# Patient Record
Sex: Female | Born: 1960
Health system: Southern US, Community
[De-identification: ages and names within clinical notes are randomized; demographics above are authoritative.]

## PROBLEM LIST (undated history)

## (undated) DIAGNOSIS — R209 Unspecified disturbances of skin sensation: Secondary | ICD-10-CM

## (undated) DIAGNOSIS — F411 Generalized anxiety disorder: Secondary | ICD-10-CM

## (undated) DIAGNOSIS — K219 Gastro-esophageal reflux disease without esophagitis: Secondary | ICD-10-CM

## (undated) DIAGNOSIS — J45909 Unspecified asthma, uncomplicated: Secondary | ICD-10-CM

## (undated) DIAGNOSIS — J302 Other seasonal allergic rhinitis: Secondary | ICD-10-CM

## (undated) DIAGNOSIS — I10 Essential (primary) hypertension: Secondary | ICD-10-CM

## (undated) DIAGNOSIS — N951 Menopausal and female climacteric states: Secondary | ICD-10-CM

## (undated) DIAGNOSIS — C44621 Squamous cell carcinoma of skin of unspecified upper limb, including shoulder: Secondary | ICD-10-CM

## (undated) DIAGNOSIS — J309 Allergic rhinitis, unspecified: Secondary | ICD-10-CM

## (undated) HISTORY — DX: Unspecified asthma, uncomplicated: J45.909

## (undated) HISTORY — PX: LASIK: SHX215

## (undated) HISTORY — DX: Gastro-esophageal reflux disease without esophagitis: K21.9

## (undated) HISTORY — DX: Essential (primary) hypertension: I10

## (undated) HISTORY — DX: Generalized anxiety disorder: F41.1

## (undated) HISTORY — DX: Other seasonal allergic rhinitis: J30.2

## (undated) HISTORY — DX: Menopausal and female climacteric states: N95.1

## (undated) HISTORY — DX: Allergic rhinitis, unspecified: J30.9

## (undated) HISTORY — PX: SQUAMOUS CELL CARCINOMA EXCISION: SHX2433

## (undated) HISTORY — DX: Unspecified disturbances of skin sensation: R20.9

## (undated) HISTORY — DX: Squamous cell carcinoma of skin of unspecified upper limb, including shoulder: C44.621

---

## 1998-03-06 ENCOUNTER — Other Ambulatory Visit: Admission: RE | Admit: 1998-03-06 | Discharge: 1998-03-06 | Payer: Self-pay | Admitting: Obstetrics and Gynecology

## 1999-03-25 ENCOUNTER — Other Ambulatory Visit: Admission: RE | Admit: 1999-03-25 | Discharge: 1999-03-25 | Payer: Self-pay | Admitting: Obstetrics and Gynecology

## 1999-04-15 ENCOUNTER — Other Ambulatory Visit: Admission: RE | Admit: 1999-04-15 | Discharge: 1999-04-15 | Payer: Self-pay | Admitting: Obstetrics and Gynecology

## 1999-04-15 ENCOUNTER — Encounter (INDEPENDENT_AMBULATORY_CARE_PROVIDER_SITE_OTHER): Payer: Self-pay

## 1999-10-09 ENCOUNTER — Other Ambulatory Visit: Admission: RE | Admit: 1999-10-09 | Discharge: 1999-10-09 | Payer: Self-pay | Admitting: Obstetrics and Gynecology

## 2000-04-08 ENCOUNTER — Other Ambulatory Visit: Admission: RE | Admit: 2000-04-08 | Discharge: 2000-04-08 | Payer: Self-pay | Admitting: Obstetrics and Gynecology

## 2001-05-08 ENCOUNTER — Other Ambulatory Visit: Admission: RE | Admit: 2001-05-08 | Discharge: 2001-05-08 | Payer: Self-pay | Admitting: Obstetrics and Gynecology

## 2002-06-07 ENCOUNTER — Other Ambulatory Visit: Admission: RE | Admit: 2002-06-07 | Discharge: 2002-06-07 | Payer: Self-pay | Admitting: Obstetrics and Gynecology

## 2003-08-22 ENCOUNTER — Other Ambulatory Visit: Admission: RE | Admit: 2003-08-22 | Discharge: 2003-08-22 | Payer: Self-pay | Admitting: Obstetrics and Gynecology

## 2004-07-22 ENCOUNTER — Ambulatory Visit: Payer: Self-pay | Admitting: Internal Medicine

## 2004-10-20 ENCOUNTER — Other Ambulatory Visit: Admission: RE | Admit: 2004-10-20 | Discharge: 2004-10-20 | Payer: Self-pay | Admitting: Obstetrics and Gynecology

## 2004-10-22 ENCOUNTER — Ambulatory Visit: Payer: Self-pay | Admitting: Internal Medicine

## 2006-10-28 ENCOUNTER — Ambulatory Visit: Payer: Self-pay | Admitting: Internal Medicine

## 2008-01-18 ENCOUNTER — Encounter: Admission: RE | Admit: 2008-01-18 | Discharge: 2008-01-18 | Payer: Self-pay | Admitting: Obstetrics and Gynecology

## 2008-01-23 ENCOUNTER — Ambulatory Visit: Payer: Self-pay | Admitting: Internal Medicine

## 2008-01-23 DIAGNOSIS — J309 Allergic rhinitis, unspecified: Secondary | ICD-10-CM

## 2008-01-23 DIAGNOSIS — J45909 Unspecified asthma, uncomplicated: Secondary | ICD-10-CM

## 2008-01-23 DIAGNOSIS — T7840XA Allergy, unspecified, initial encounter: Secondary | ICD-10-CM

## 2008-01-23 DIAGNOSIS — F411 Generalized anxiety disorder: Secondary | ICD-10-CM

## 2008-01-23 DIAGNOSIS — K219 Gastro-esophageal reflux disease without esophagitis: Secondary | ICD-10-CM

## 2008-01-23 DIAGNOSIS — R319 Hematuria, unspecified: Secondary | ICD-10-CM

## 2008-01-23 DIAGNOSIS — K5289 Other specified noninfective gastroenteritis and colitis: Secondary | ICD-10-CM

## 2008-01-23 HISTORY — DX: Gastro-esophageal reflux disease without esophagitis: K21.9

## 2008-01-23 HISTORY — DX: Allergic rhinitis, unspecified: J30.9

## 2008-01-23 HISTORY — DX: Generalized anxiety disorder: F41.1

## 2008-01-23 HISTORY — DX: Unspecified asthma, uncomplicated: J45.909

## 2008-01-23 LAB — CONVERTED CEMR LAB
Protein, U semiquant: >=300
pH: 5

## 2008-02-05 ENCOUNTER — Ambulatory Visit: Payer: Self-pay | Admitting: Internal Medicine

## 2008-02-05 LAB — CONVERTED CEMR LAB
Glucose, Urine, Semiquant: NEGATIVE
pH: 5

## 2008-08-20 ENCOUNTER — Ambulatory Visit: Payer: Self-pay | Admitting: Internal Medicine

## 2008-08-20 DIAGNOSIS — R209 Unspecified disturbances of skin sensation: Secondary | ICD-10-CM | POA: Insufficient documentation

## 2008-08-20 HISTORY — DX: Unspecified disturbances of skin sensation: R20.9

## 2009-09-12 ENCOUNTER — Ambulatory Visit: Payer: Self-pay | Admitting: Internal Medicine

## 2009-09-12 DIAGNOSIS — I10 Essential (primary) hypertension: Secondary | ICD-10-CM

## 2009-09-12 HISTORY — DX: Essential (primary) hypertension: I10

## 2009-09-12 LAB — CONVERTED CEMR LAB
AST: 16 units/L (ref 0–37)
Albumin: 4.3 g/dL (ref 3.5–5.2)
CO2: 17 meq/L — ABNORMAL LOW (ref 19–32)
Calcium: 8.7 mg/dL (ref 8.4–10.5)
Creatinine, Ser: 0.78 mg/dL (ref 0.40–1.20)
Eosinophils Absolute: 0.2 10*3/uL (ref 0.0–0.7)
HCT: 41.5 % (ref 36.0–46.0)
Hemoglobin: 13.7 g/dL (ref 12.0–15.0)
Lymphocytes Relative: 21.4 % (ref 12.0–46.0)
Lymphs Abs: 1.2 10*3/uL (ref 0.7–4.0)
MCV: 99 fL (ref 78.0–100.0)
Monocytes Absolute: 0.5 10*3/uL (ref 0.1–1.0)
Neutrophils Relative %: 66.7 % (ref 43.0–77.0)
Platelets: 249 10*3/uL (ref 150.0–400.0)
RBC: 4.19 M/uL (ref 3.87–5.11)
RDW: 13.4 % (ref 11.5–14.6)

## 2009-09-15 ENCOUNTER — Encounter: Payer: Self-pay | Admitting: Internal Medicine

## 2009-10-14 ENCOUNTER — Ambulatory Visit: Payer: Self-pay | Admitting: Internal Medicine

## 2010-01-22 ENCOUNTER — Encounter: Admission: RE | Admit: 2010-01-22 | Discharge: 2010-01-22 | Payer: Self-pay | Admitting: Obstetrics and Gynecology

## 2010-04-15 ENCOUNTER — Ambulatory Visit: Payer: Self-pay | Admitting: Internal Medicine

## 2010-09-20 ENCOUNTER — Encounter: Payer: Self-pay | Admitting: Obstetrics and Gynecology

## 2010-10-01 NOTE — Assessment & Plan Note (Signed)
Summary: 6 month rov/njr/PT [facility] FROM BMP/CJR   Vital Signs:  Patient profile:   50 year old female Weight:      139 pounds Temp:     98.1 degrees F oral BP sitting:   120 / 80  (left arm) Cuff size:   regular  Vitals Entered By: Kathrynn Speed CMA (April 15, 2010 11:03 AM) CC: 6 month rov, FU on BP,src   CC:  6 month rov, FU on BP, and src.  History of Present Illness: 50 year old patient who is seen today for follow-up.  She has a history of hypertension and does monitor blood pressures carefully at home.  She continues to lose weight.  Blood pressure readings at home and also by GYN and allergy have been well controlled.  No concerns or complaints today.  Her allergy and asthma symptoms, stable  Current Medications (verified): 1)  Zyrtec Allergy 10 Mg  Tabs (Cetirizine Hcl) .Marland Kitchen.. 1 Once Daily 2)  Cvs Omeprazole 20 Mg  Tbec (Omeprazole) .Marland Kitchen.. 1 Once Daily 3)  Asmanex 30 Metered Doses 110 Mcg/inh  Aepb (Mometasone Furoate) .... 2 Puffs Once Daily 4)  Lisinopril-Hydrochlorothiazide 10-12.5 Mg Tabs (Lisinopril-Hydrochlorothiazide) .... One Daily  Allergies (verified): No Known Drug Allergies  Past History:  Past Medical History: Reviewed history from 09/12/2009 and no changes required. Asthma Anxiety GERD Allergic rhinitis Hypertension- January 2011  Past Surgical History: Reviewed history from 01/23/2008 and no changes required. gravida two, para two, aborta zero  Physical Exam  General:  Well-developed,well-nourished,in no acute distress; alert,appropriate and cooperative throughout examination; 110/74 Eyes:  No corneal or conjunctival inflammation noted. EOMI. Perrla. Funduscopic exam benign, without hemorrhages, exudates or papilledema. Vision grossly normal. Mouth:  Oral mucosa and oropharynx without lesions or exudates.  Teeth in good repair. Neck:  No deformities, masses, or tenderness noted. Lungs:  Normal respiratory effort, chest expands symmetrically. Lungs  are clear to auscultation, no crackles or wheezes. Heart:  Normal rate and regular rhythm. S1 and S2 normal without gallop, murmur, click, rub or other extra sounds.   Impression & Recommendations:  Problem # 1:  HYPERTENSION (ICD-401.9)  Her updated medication list for this problem includes:    Lisinopril-hydrochlorothiazide 10-12.5 Mg Tabs (Lisinopril-hydrochlorothiazide) ..... One daily  Her updated medication list for this problem includes:    Lisinopril-hydrochlorothiazide 10-12.5 Mg Tabs (Lisinopril-hydrochlorothiazide) ..... One daily  Complete Medication List: 1)  Zyrtec Allergy 10 Mg Tabs (Cetirizine hcl) .Marland Kitchen.. 1 once daily 2)  Cvs Omeprazole 20 Mg Tbec (Omeprazole) .Marland Kitchen.. 1 once daily 3)  Asmanex 30 Metered Doses 110 Mcg/inh Aepb (Mometasone furoate) .... 2 puffs once daily 4)  Lisinopril-hydrochlorothiazide 10-12.5 Mg Tabs (Lisinopril-hydrochlorothiazide) .... One daily  Patient Instructions: 1)  Please schedule a follow-up appointment in 1 year. 2)  Limit your Sodium (Salt). 3)  It is important that you exercise regularly at least 20 minutes 5 times a week. If you develop chest pain, have severe difficulty breathing, or feel very tired , stop exercising immediately and seek medical attention. 4)  You need to lose weight. Consider a lower calorie diet and regular exercise.  5)  Check your Blood Pressure regularly. If it is above: 160/90 you should make an appointment. Prescriptions: LISINOPRIL-HYDROCHLOROTHIAZIDE 10-12.5 MG TABS (LISINOPRIL-HYDROCHLOROTHIAZIDE) one daily  #90 x 6   Entered and Authorized by:   Gordy Savers  MD   Signed by:   Gordy Savers  MD on 04/15/2010   Method used:   Electronically to  [facility]* (retail)       [address]       [place], [place]  [id number]       Ph: [phone]       Fax: [phone]   RxID:   [id number]

## 2010-10-01 NOTE — Letter (Signed)
Summary: [facility]  [facility]   Imported By: Sherian Rein 09/24/2009 08:17:26  _____________________________________________________________________  External Attachment:    Type:   Image     Comment:   External Document

## 2010-10-01 NOTE — Assessment & Plan Note (Signed)
Summary: bp elevated/njr   Vital Signs:  Patient profile:   50 year old female Weight:      156 pounds Temp:     97.9 degrees F oral BP sitting:   160 / 108  (left arm) Cuff size:   regular  Vitals Entered By: Raechel Ache, RN (September 12, 2009 10:55 AM) CC: C/o elevated BP.   CC:  C/o elevated BP.Marland Kitchen  History of Present Illness: 50 year old patient who is seen today to evaluate her hypertension.  She has been a hypertensive suspect in the past, but has maintained  good readings until recently.  she is followed by gynecology and allergy.  She also monitor his home blood pressure readings.  In spite of weight loss.  Her blood pressure has become elevated recently.  She does have a family history of hypertension.  She feels well today, although slightly anxious about her low-pressure readings.  Allergies: No Known Drug Allergies  Past History:  Past Medical History: Asthma Anxiety GERD Allergic rhinitis Hypertension- January 2011  Family History: Reviewed history from 01/23/2008 and no changes required. father history of CAD, history of hypertension mother in good health  One brother two sisters in good health one with hypertension  Social History: Reviewed history from 01/23/2008 and no changes required. Married  Review of Systems  The patient denies anorexia, fever, weight loss, weight gain, vision loss, decreased hearing, hoarseness, chest pain, syncope, dyspnea on exertion, peripheral edema, prolonged cough, headaches, hemoptysis, abdominal pain, melena, hematochezia, severe indigestion/heartburn, hematuria, incontinence, genital sores, muscle weakness, suspicious skin lesions, transient blindness, difficulty walking, depression, unusual weight change, abnormal bleeding, enlarged lymph nodes, angioedema, and breast masses.    Physical Exam  General:  overweight-appearing.  160/100overweight-appearing.   Head:  Normocephalic and atraumatic without obvious  abnormalities. No apparent alopecia or balding. Eyes:  No corneal or conjunctival inflammation noted. EOMI. Perrla. Funduscopic exam benign, without hemorrhages, exudates or papilledema. Vision grossly normal. Ears:  External ear exam shows no significant lesions or deformities.  Otoscopic examination reveals clear canals, tympanic membranes are intact bilaterally without bulging, retraction, inflammation or discharge. Hearing is grossly normal bilaterally. Mouth:  Oral mucosa and oropharynx without lesions or exudates.  Teeth in good repair. Neck:  No deformities, masses, or tenderness noted. Lungs:  Normal respiratory effort, chest expands symmetrically. Lungs are clear to auscultation, no crackles or wheezes. Heart:  Normal rate and regular rhythm. S1 and S2 normal without gallop, murmur, click, rub or other extra sounds. Abdomen:  Bowel sounds positive,abdomen soft and non-tender without masses, organomegaly or hernias noted. Msk:  No deformity or scoliosis noted of thoracic or lumbar spine.   Pulses:  R and L carotid,radial,femoral,dorsalis pedis and posterior tibial pulses are full and equal bilaterally Extremities:  No clubbing, cyanosis, edema, or deformity noted with normal full range of motion of all joints.     Impression & Recommendations:  Problem # 1:  HYPERTENSION (ICD-401.9)  Orders: Venipuncture (91478) TLB-BMP (Basic Metabolic Panel-BMET) (80048-METABOL) TLB-CBC Platelet - w/Differential (85025-CBCD) TLB-Hepatic/Liver Function Pnl (80076-HEPATIC) TLB-TSH (Thyroid Stimulating Hormone) (84443-TSH)  Her updated medication list for this problem includes:    Lisinopril-hydrochlorothiazide 20-25 Mg Tabs (Lisinopril-hydrochlorothiazide) ..... One daily  Problem # 2:  ALLERGIC RHINITIS (ICD-477.9)  Her updated medication list for this problem includes:    Zyrtec Allergy 10 Mg Tabs (Cetirizine hcl) .Marland Kitchen... 1 once daily  Orders: Venipuncture (29562) TLB-BMP (Basic Metabolic  Panel-BMET) (80048-METABOL) TLB-CBC Platelet - w/Differential (85025-CBCD) TLB-Hepatic/Liver Function Pnl (80076-HEPATIC) TLB-TSH (Thyroid Stimulating Hormone) (  84443-TSH)  Complete Medication List: 1)  Zyrtec Allergy 10 Mg Tabs (Cetirizine hcl) .Marland Kitchen.. 1 once daily 2)  Cvs Omeprazole 20 Mg Tbec (Omeprazole) .Marland Kitchen.. 1 once daily 3)  Asmanex 30 Metered Doses 110 Mcg/inh Aepb (Mometasone furoate) .... 2 puffs once daily 4)  Lisinopril-hydrochlorothiazide 20-25 Mg Tabs (Lisinopril-hydrochlorothiazide) .... One daily  Patient Instructions: 1)  Please schedule a follow-up appointment in 1 month. 2)  Limit your Sodium (Salt). 3)  It is important that you exercise regularly at least 20 minutes 5 times a week. If you develop chest pain, have severe difficulty breathing, or feel very tired , stop exercising immediately and seek medical attention. 4)  You need to lose weight. Consider a lower calorie diet and regular exercise.  5)  Check your Blood Pressure regularly. If it is above: 150/90 you should make an appointment. Prescriptions: LISINOPRIL-HYDROCHLOROTHIAZIDE 20-25 MG TABS (LISINOPRIL-HYDROCHLOROTHIAZIDE) one daily  #90 x 3   Entered and Authorized by:   Gordy Savers  MD   Signed by:   Gordy Savers  MD on 09/12/2009   Method used:   Print then Give to Patient   RxID:   [id number] LISINOPRIL-HYDROCHLOROTHIAZIDE 20-25 MG TABS (LISINOPRIL-HYDROCHLOROTHIAZIDE) one daily  #90 x 3   Entered and Authorized by:   Gordy Savers  MD   Signed by:   Gordy Savers  MD on 09/12/2009   Method used:   Electronically to        [facility]* (retail)       [address]       [place], [place]  [address]       Ph: [phone]       Fax: [phone]   RxID:   [id number]   Appended Document: Orders Update    Clinical Lists Changes  Orders: Added new Test order of T- * Misc. Laboratory test [phone]) - Signed

## 2010-10-01 NOTE — Assessment & Plan Note (Signed)
Summary: 1 mo rov/mm   Vital Signs:  Patient profile:   50 year old female Height:      62.5 inches Weight:      149 pounds Temp:     97.7 degrees F oral BP sitting:   108 / 70  (right arm)  Vitals Entered By: Duard Brady LPN (October 14, 2009 8:41 AM) CC: ROV / f/u on medication and labs   CC:  ROV / f/u on medication and labs.  History of Present Illness: 50 year old patient who is seen today for follow-up of her hypertension.  She has a history of mild asthma, which has been stable.  She was placed on blood pressure medication, one month ago, due to a blood pressure reading of 160/100.  She has done quite well and continues to monitor blood pressure readings at home.  She has rare episodes of dizziness when she bends over.  Blood pressures are often and a low normal range.  Blood pressure on arrival here 110/70 by my exam as low as 95/60  Allergies: No Known Drug Allergies  Past History:  Past Medical History: Reviewed history from 09/12/2009 and no changes required. Asthma Anxiety GERD Allergic rhinitis Hypertension- January 2011  Physical Exam  General:  overweight-appearing.  95/60overweight-appearing.     Impression & Recommendations:  Problem # 1:  HYPERTENSION (ICD-401.9)  The following medications were removed from the medication list:    Lisinopril-hydrochlorothiazide 20-25 Mg Tabs (Lisinopril-hydrochlorothiazide) ..... One daily Her updated medication list for this problem includes:    Lisinopril-hydrochlorothiazide 10-12.5 Mg Tabs (Lisinopril-hydrochlorothiazide) ..... One daily  The following medications were removed from the medication list:    Lisinopril-hydrochlorothiazide 20-25 Mg Tabs (Lisinopril-hydrochlorothiazide) ..... One daily Her updated medication list for this problem includes:    Lisinopril-hydrochlorothiazide 10-12.5 Mg Tabs (Lisinopril-hydrochlorothiazide) ..... One daily  Complete Medication List: 1)  Zyrtec Allergy 10 Mg  Tabs (Cetirizine hcl) .Marland Kitchen.. 1 once daily 2)  Cvs Omeprazole 20 Mg Tbec (Omeprazole) .Marland Kitchen.. 1 once daily 3)  Asmanex 30 Metered Doses 110 Mcg/inh Aepb (Mometasone furoate) .... 2 puffs once daily 4)  Lisinopril-hydrochlorothiazide 10-12.5 Mg Tabs (Lisinopril-hydrochlorothiazide) .... One daily  Patient Instructions: 1)  Please schedule a follow-up appointment in 6 months. 2)  Limit your Sodium (Salt). 3)  It is important that you exercise regularly at least 20 minutes 5 times a week. If you develop chest pain, have severe difficulty breathing, or feel very tired , stop exercising immediately and seek medical attention. 4)  You need to lose weight. Consider a lower calorie diet and regular exercise.  5)  Check your Blood Pressure regularly. If it is above: 150/90  you should make an appointment. Prescriptions: LISINOPRIL-HYDROCHLOROTHIAZIDE 10-12.5 MG TABS (LISINOPRIL-HYDROCHLOROTHIAZIDE) one daily  #90 x 6   Entered and Authorized by:   Gordy Savers  MD   Signed by:   Gordy Savers  MD on 10/14/2009   Method used:   Print then Give to Patient   RxID:   [id number]

## 2010-12-15 ENCOUNTER — Encounter: Payer: Self-pay | Admitting: Internal Medicine

## 2010-12-15 ENCOUNTER — Ambulatory Visit (INDEPENDENT_AMBULATORY_CARE_PROVIDER_SITE_OTHER): Payer: BLUE CROSS/BLUE SHIELD | Admitting: Internal Medicine

## 2010-12-15 DIAGNOSIS — H9209 Otalgia, unspecified ear: Secondary | ICD-10-CM

## 2010-12-15 MED ORDER — NEOMYCIN-POLYMYXIN-HC 3.5-10000-1 OT SOLN: 3.0000 [drp] | Freq: Three times a day (TID) | OTIC | Status: AC

## 2010-12-15 NOTE — Patient Instructions (Signed)
Call or return to clinic prn if these symptoms worsen or fail to improve as anticipated.

## 2010-12-15 NOTE — Progress Notes (Signed)
  Subjective:    Patient ID: Brandy Todd, female    DOB: 15-Dec-1960, 50 y.o.   MRN: [id number]  HPI 50 year old patient who complains of some discomfort in the left ear area. She removed some cerumen yesterday. No fever or URI symptoms. She does describe some very mild sinus congestion. She does have a history of allergic rhinitis.   Review of Systems  Eyes: Positive for pain.       Objective:   Physical Exam  Constitutional: She appears well-developed and well-nourished. No distress.  HENT:  Head: Normocephalic and atraumatic.  Right Ear: External ear normal.  Mouth/Throat: Oropharynx is clear and moist.       Both tympanic membranes and canals were normal          Assessment & Plan:  Otalgia. We'll give a prescription for Cortisporin Otic. We'll simply observe over the next 24 hours. It is likely that her symptoms will resolve since her cerumen has been removed. She will take the otic drops only if her symptoms worsen

## 2011-01-09 ENCOUNTER — Other Ambulatory Visit: Payer: Self-pay | Admitting: Internal Medicine

## 2011-04-12 ENCOUNTER — Other Ambulatory Visit (INDEPENDENT_AMBULATORY_CARE_PROVIDER_SITE_OTHER): Payer: BLUE CROSS/BLUE SHIELD

## 2011-04-12 DIAGNOSIS — Z0000 Encounter for general adult medical examination without abnormal findings: Secondary | ICD-10-CM

## 2011-04-12 LAB — HEPATIC FUNCTION PANEL
ALT: 17 U/L (ref 0–35)
Albumin: 3.8 g/dL (ref 3.5–5.2)
Alkaline Phosphatase: 52 U/L (ref 39–117)
Bilirubin, Direct: 0 mg/dL (ref 0.0–0.3)
Total Bilirubin: 0.6 mg/dL (ref 0.3–1.2)

## 2011-04-12 LAB — BASIC METABOLIC PANEL
BUN: 19 mg/dL (ref 6–23)
Chloride: 99 mEq/L (ref 96–112)
Creatinine, Ser: 0.9 mg/dL (ref 0.4–1.2)

## 2011-04-12 LAB — CBC WITH DIFFERENTIAL/PLATELET
Basophils Relative: 0.6 % (ref 0.0–3.0)
Eosinophils Absolute: 0.4 10*3/uL (ref 0.0–0.7)
Lymphs Abs: 1.3 10*3/uL (ref 0.7–4.0)
Neutro Abs: 5.2 10*3/uL (ref 1.4–7.7)
Neutrophils Relative %: 69.4 % (ref 43.0–77.0)
Platelets: 317 10*3/uL (ref 150.0–400.0)
RDW: 12.8 % (ref 11.5–14.6)

## 2011-04-12 LAB — LIPID PANEL
Total CHOL/HDL Ratio: 2
VLDL: 24.6 mg/dL (ref 0.0–40.0)

## 2011-04-12 LAB — POCT URINALYSIS DIPSTICK
Bilirubin, UA: NEGATIVE
Nitrite, UA: NEGATIVE
pH, UA: 5.5

## 2011-04-19 ENCOUNTER — Encounter: Payer: BLUE CROSS/BLUE SHIELD | Admitting: Internal Medicine

## 2011-04-20 ENCOUNTER — Encounter: Payer: Self-pay | Admitting: Internal Medicine

## 2011-04-20 ENCOUNTER — Ambulatory Visit (INDEPENDENT_AMBULATORY_CARE_PROVIDER_SITE_OTHER): Payer: BLUE CROSS/BLUE SHIELD | Admitting: Internal Medicine

## 2011-04-20 VITALS — BP 120/80 | HR 70 | Temp 98.2°F | Resp 16 | Ht 62.0 in | Wt 145.0 lb

## 2011-04-20 DIAGNOSIS — Z23 Encounter for immunization: Secondary | ICD-10-CM

## 2011-04-20 DIAGNOSIS — Z0000 Encounter for general adult medical examination without abnormal findings: Secondary | ICD-10-CM

## 2011-04-20 MED ORDER — MOMETASONE FUROATE 220 MCG/INH IN AEPB: 2.0000 | INHALATION_SPRAY | Freq: Every day | RESPIRATORY_TRACT | Status: DC

## 2011-04-20 MED ORDER — OMEPRAZOLE 20 MG PO CPDR: 20.0000 mg | DELAYED_RELEASE_CAPSULE | Freq: Every day | ORAL | Status: DC

## 2011-04-20 MED ORDER — LISINOPRIL-HYDROCHLOROTHIAZIDE 10-12.5 MG PO TABS: 1.0000 | ORAL_TABLET | Freq: Every day | ORAL | Status: DC

## 2011-04-20 NOTE — Progress Notes (Signed)
Subjective:    Patient ID: Brandy Todd, female    DOB: 1960-09-28, 50 y.o.   MRN: [id number]  HPI History of Present Illness:  50 year-old patient who is seen today for follow-up. She has a history of hypertension and does monitor blood pressures carefully at home. She continues to lose weight. Blood pressure readings at home and also by GYN and allergy have been well controlled. No concerns or complaints today. Her allergy and asthma symptoms, stable   Current Medications (verified):  1) Zyrtec Allergy 10 Mg Tabs (Cetirizine Hcl) .Marland Kitchen.. 1 Once Daily  2) Cvs Omeprazole 20 Mg Tbec (Omeprazole) .Marland Kitchen.. 1 Once Daily  3) Asmanex 30 Metered Doses 110 Mcg/inh Aepb (Mometasone Furoate) .... 2 Puffs Once Daily  4) Lisinopril-Hydrochlorothiazide 10-12.5 Mg Tabs (Lisinopril-Hydrochlorothiazide) .... One Daily   Allergies (verified):  No Known Drug Allergies   Past History:  Past Medical History:  Reviewed history from 09/12/2009 and no changes required.  Asthma  Anxiety  GERD  Allergic rhinitis  Hypertension- January 2011   Past Surgical History:  Reviewed history from 01/23/2008 and no changes required.  gravida two, para two, aborta zero    Review of Systems  Constitutional: Negative for fever, appetite change, fatigue and unexpected weight change.  HENT: Negative for hearing loss, ear pain, nosebleeds, congestion, sore throat, mouth sores, trouble swallowing, neck stiffness, dental problem, voice change, sinus pressure and tinnitus.   Eyes: Negative for photophobia, pain, redness and visual disturbance.  Respiratory: Negative for cough, chest tightness and shortness of breath.   Cardiovascular: Negative for chest pain, palpitations and leg swelling.  Gastrointestinal: Negative for nausea, vomiting, abdominal pain, diarrhea, constipation, blood in stool, abdominal distention and rectal pain.  Genitourinary: Negative for dysuria, urgency, frequency, hematuria, flank pain, vaginal bleeding,  vaginal discharge, difficulty urinating, genital sores, vaginal pain, menstrual problem and pelvic pain.  Musculoskeletal: Negative for back pain and arthralgias.  Skin: Negative for rash.  Neurological: Negative for dizziness, syncope, speech difficulty, weakness, light-headedness, numbness and headaches.  Hematological: Negative for adenopathy. Does not bruise/bleed easily.  Psychiatric/Behavioral: Negative for suicidal ideas, behavioral problems, self-injury, dysphoric mood and agitation. The patient is not nervous/anxious.        Objective:   Physical Exam  Constitutional: She is oriented to person, place, and time. She appears well-developed and well-nourished.  HENT:  Head: Normocephalic and atraumatic.  Right Ear: External ear normal.  Left Ear: External ear normal.  Mouth/Throat: Oropharynx is clear and moist.  Eyes: Conjunctivae and EOM are normal.  Neck: Normal range of motion. Neck supple. No JVD present. No thyromegaly present.  Cardiovascular: Normal rate, regular rhythm, normal heart sounds and intact distal pulses.   No murmur heard. Pulmonary/Chest: Effort normal and breath sounds normal. She has no wheezes. She has no rales.  Abdominal: Soft. Bowel sounds are normal. She exhibits no distension and no mass. There is no tenderness. There is no rebound and no guarding.  Musculoskeletal: Normal range of motion. She exhibits no edema and no tenderness.  Neurological: She is alert and oriented to person, place, and time. She has normal reflexes. No cranial nerve deficit. She exhibits normal muscle tone. Coordination normal.  Skin: Skin is warm and dry. No rash noted.  Psychiatric: She has a normal mood and affect. Her behavior is normal.          Assessment & Plan:      Preventive health examination. Laboratory studies were reviewed. The patient has been asked to moderate her sun  exposure calcium and vitamin D supplements encouraged. She'll be set up for a screening  colonoscopy. She has been asked to monitor home blood pressure readings and will recheck in one year. She'll continue her annual gynecologic evaluation

## 2011-04-20 NOTE — Patient Instructions (Signed)
Limit your sodium (Salt) intake  Take a calcium supplement, plus [phone] units of vitamin D  Schedule your colonoscopy to help detect colon cancer.  Moderate your sun exposed- excessive sun exposure increases your risk of skin cancer and premature aging of the skin    It is important that you exercise regularly, at least 20 minutes 3 to 4 times per week.  If you develop chest pain or shortness of breath seek  medical attention.  Please check your blood pressure on a regular basis.  If it is consistently greater than 150/90, please make an office appointment.  Return in one year for follow-up

## 2011-08-31 HISTORY — PX: COLONOSCOPY: SHX174

## 2011-12-17 ENCOUNTER — Telehealth: Payer: Self-pay | Admitting: Internal Medicine

## 2011-12-17 DIAGNOSIS — Z1211 Encounter for screening for malignant neoplasm of colon: Secondary | ICD-10-CM

## 2011-12-17 NOTE — Telephone Encounter (Signed)
Spoke with pt - looks like info went in but never made it ot terri - will re-order and she should hear from her next week.

## 2011-12-17 NOTE — Telephone Encounter (Signed)
Patient called stating that when she was in last someone was supposed to call her to schedule a colonoscopy. Please advise.

## 2011-12-24 ENCOUNTER — Encounter: Payer: Self-pay | Admitting: Internal Medicine

## 2012-01-04 ENCOUNTER — Encounter: Payer: Self-pay | Admitting: Internal Medicine

## 2012-01-04 ENCOUNTER — Ambulatory Visit (AMBULATORY_SURGERY_CENTER): Payer: BC Managed Care – PPO

## 2012-01-04 VITALS — Ht 62.0 in | Wt 148.2 lb

## 2012-01-04 DIAGNOSIS — Z1211 Encounter for screening for malignant neoplasm of colon: Secondary | ICD-10-CM

## 2012-01-04 MED ORDER — NA SULFATE-K SULFATE-MG SULF 17.5-3.13-1.6 GM/177ML PO SOLN: 1.0000 | Freq: Once | ORAL | Status: DC

## 2012-01-18 ENCOUNTER — Encounter: Payer: Self-pay | Admitting: Internal Medicine

## 2012-01-18 ENCOUNTER — Ambulatory Visit (AMBULATORY_SURGERY_CENTER): Payer: BC Managed Care – PPO | Admitting: Internal Medicine

## 2012-01-18 VITALS — BP 136/98 | HR 67 | Temp 96.3°F | Resp 16 | Ht 62.0 in | Wt 148.0 lb

## 2012-01-18 DIAGNOSIS — D1330 Benign neoplasm of unspecified part of small intestine: Secondary | ICD-10-CM

## 2012-01-18 DIAGNOSIS — K5000 Crohn's disease of small intestine without complications: Secondary | ICD-10-CM

## 2012-01-18 DIAGNOSIS — Z1211 Encounter for screening for malignant neoplasm of colon: Secondary | ICD-10-CM

## 2012-01-18 MED ORDER — SODIUM CHLORIDE 0.9 % IV SOLN: 500.0000 mL | INTRAVENOUS | Status: DC

## 2012-01-18 NOTE — Patient Instructions (Signed)
YOU HAD AN ENDOSCOPIC PROCEDURE TODAY AT THE [facility]: Refer to the procedure report that was given to you for any specific questions about what was found during the examination.  If the procedure report does not answer your questions, please call your gastroenterologist to clarify.  If you requested that your care partner not be given the details of your procedure findings, then the procedure report has been included in a sealed envelope for you to review at your convenience later.  YOU SHOULD EXPECT: Some feelings of bloating in the abdomen. Passage of more gas than usual.  Walking can help get rid of the air that was put into your GI tract during the procedure and reduce the bloating. If you had a lower endoscopy (such as a colonoscopy or flexible sigmoidoscopy) you may notice spotting of blood in your stool or on the toilet paper. If you underwent a bowel prep for your procedure, then you may not have a normal bowel movement for a few days.  DIET: Your first meal following the procedure should be a light meal and then it is ok to progress to your normal diet.  A half-sandwich or bowl of soup is an example of a good first meal.  Heavy or fried foods are harder to digest and may make you feel nauseous or bloated.  Likewise meals heavy in dairy and vegetables can cause extra gas to form and this can also increase the bloating.  Drink plenty of fluids but you should avoid alcoholic beverages for 24 hours.  ACTIVITY: Your care partner should take you home directly after the procedure.  You should plan to take it easy, moving slowly for the rest of the day.  You can resume normal activity the day after the procedure however you should NOT DRIVE or use heavy machinery for 24 hours (because of the sedation medicines used during the test).    SYMPTOMS TO REPORT IMMEDIATELY: A gastroenterologist can be reached at any hour.  During normal business hours, 8:30 AM to 5:00 PM Monday through Friday,  call [phone].  After hours and on weekends, please call the GI answering service at [phone] who will take a message and have the physician on call contact you.   Following lower endoscopy (colonoscopy or flexible sigmoidoscopy):  Excessive amounts of blood in the stool  Significant tenderness or worsening of abdominal pains  Swelling of the abdomen that is new, acute  Fever of 100F or higher    FOLLOW UP: If any biopsies were taken you will be contacted by phone or by letter within the next 1-3 weeks.  Call your gastroenterologist if you have not heard about the biopsies in 3 weeks.  Our staff will call the home number listed on your records the next business day following your procedure to check on you and address any questions or concerns that you may have at that time regarding the information given to you following your procedure. This is a courtesy call and so if there is no answer at the home number and we have not heard from you through the emergency physician on call, we will assume that you have returned to your regular daily activities without incident.  SIGNATURES/CONFIDENTIALITY: You and/or your care partner have signed paperwork which will be entered into your electronic medical record.  These signatures attest to the fact that that the information above on your After Visit Summary has been reviewed and is understood.  Full responsibility of the confidentiality   of this discharge information lies with you and/or your care-partner.    Information on diverticulosis and high fiber diet given to you.

## 2012-01-18 NOTE — Op Note (Signed)
 [facility] [address] [place], [place]  [address]  COLONOSCOPY PROCEDURE REPORT  PATIENT:  Brandy Todd, Brandy Todd  MR#:  [id number] BIRTHDATE:  [birth year], 51 yrs. old  GENDER:  female ENDOSCOPIST:  Carie Caddy. Mackenzie Groom, MD REF. BY:  Eleonore Chiquito, M.D. PROCEDURE DATE:  01/18/2012 PROCEDURE:  Colonoscopy with biopsy ASA CLASS:  Class II INDICATIONS:  Routine Risk Screening, 1st colonoscopy MEDICATIONS:   MAC sedation, administered by CRNA, propofol (Diprivan) 350 mg IV  DESCRIPTION OF PROCEDURE:   After the risks benefits and alternatives of the procedure were thoroughly explained, informed consent was obtained.  Digital rectal exam was performed and revealed no rectal masses.   The LB CF-[id number] endoscope was introduced through the anus and advanced to the terminal ileum which was intubated for a short distance, without limitations. The quality of the prep was Suprep good.  The instrument was then slowly withdrawn as the colon was fully examined. <<PROCEDUREIMAGES>> FINDINGS:  There were very mild inflammatory changes in the terminal ileum, characterized by erythema and a few superficial ulcerations. Multiple biopsies were obtained and sent to pathology.  Mild diverticulosis was found in the sigmoid colon. This was otherwise a normal examination of the colon.   Retroflexed views in the rectum revealed no abnormalities.  The scope was then withdrawn from the cecum and the procedure completed.  COMPLICATIONS:  None  ENDOSCOPIC IMPRESSION: 1) Mild ileitis in the terminal ileum.  Multiple biopsies obtained and sent to pathology. 2) Mild diverticulosis in the sigmoid colon 3) Otherwise normal examination  RECOMMENDATIONS: 1) Await pathology results 2) High fiber diet. 3) You should continue to follow colorectal cancer screening guidelines for "routine risk" patients with a repeat colonoscopy in 10 years. There is no need for FOBT (stool) testing for at least 5  years. 4) You will receive a letter within 1-2 weeks with the results of your biopsy as well as final recommendations. Please call my office if you have not received a letter after 3 weeks.  Carie Caddy. Rhea Belton, MD  CC:  Gordy Savers, MD The Patient  n. eSIGNED:   Carie Caddy. Adesuwa Osgood at 01/18/2012 08:53 AM  Marius Ditch, [phone]

## 2012-01-18 NOTE — Progress Notes (Signed)
Patient did not experience any of the following events: a burn prior to discharge; a fall within the facility; wrong site/side/patient/procedure/implant event; or a hospital transfer or hospital admission upon discharge from the facility. (G8907) Patient did not have preoperative order for IV antibiotic SSI prophylaxis. (G8918)  

## 2012-01-19 ENCOUNTER — Telehealth: Payer: Self-pay | Admitting: *Deleted

## 2012-01-19 NOTE — Telephone Encounter (Signed)
  Follow up Call-  Call back number 01/18/2012  Post procedure Call Back phone  # [phone]  Permission to leave phone message Yes     Patient questions:  Do you have a fever, pain , or abdominal swelling? no Pain Score  0 *  Have you tolerated food without any problems? yes  Have you been able to return to your normal activities? yes  Do you have any questions about your discharge instructions: Diet   no Medications  no Follow up visit  no  Do you have questions or concerns about your Care? no  Actions: * If pain score is 4 or above: No action needed, pain <4.

## 2012-01-27 ENCOUNTER — Encounter: Payer: Self-pay | Admitting: Internal Medicine

## 2012-04-20 ENCOUNTER — Other Ambulatory Visit (INDEPENDENT_AMBULATORY_CARE_PROVIDER_SITE_OTHER): Payer: BC Managed Care – PPO

## 2012-04-20 DIAGNOSIS — Z0000 Encounter for general adult medical examination without abnormal findings: Secondary | ICD-10-CM

## 2012-04-20 LAB — POCT URINALYSIS DIPSTICK
Bilirubin, UA: NEGATIVE
Glucose, UA: NEGATIVE
Ketones, UA: NEGATIVE
Leukocytes, UA: NEGATIVE
Spec Grav, UA: 1.015

## 2012-04-20 LAB — HEPATIC FUNCTION PANEL
Alkaline Phosphatase: 61 U/L (ref 39–117)
Bilirubin, Direct: 0.1 mg/dL (ref 0.0–0.3)
Total Bilirubin: 0.7 mg/dL (ref 0.3–1.2)

## 2012-04-20 LAB — CBC WITH DIFFERENTIAL/PLATELET
Eosinophils Absolute: 0.3 10*3/uL (ref 0.0–0.7)
Lymphocytes Relative: 26.5 % (ref 12.0–46.0)
MCHC: 33.5 g/dL (ref 30.0–36.0)
MCV: 94.2 fl (ref 78.0–100.0)
Monocytes Absolute: 0.7 10*3/uL (ref 0.1–1.0)
Neutrophils Relative %: 53.4 % (ref 43.0–77.0)
Platelets: 264 10*3/uL (ref 150.0–400.0)
RBC: 4.15 Mil/uL (ref 3.87–5.11)
WBC: 5.3 10*3/uL (ref 4.5–10.5)

## 2012-04-20 LAB — BASIC METABOLIC PANEL
BUN: 14 mg/dL (ref 6–23)
CO2: 27 mEq/L (ref 19–32)
Calcium: 8.9 mg/dL (ref 8.4–10.5)
Chloride: 99 mEq/L (ref 96–112)
Creatinine, Ser: 0.8 mg/dL (ref 0.4–1.2)

## 2012-04-20 LAB — LIPID PANEL
Cholesterol: 150 mg/dL (ref 0–200)
HDL: 75.8 mg/dL (ref >39.00)
Triglycerides: 63 mg/dL (ref 0.0–149.0)
VLDL: 12.6 mg/dL (ref 0.0–40.0)

## 2012-04-27 ENCOUNTER — Encounter: Payer: Self-pay | Admitting: Internal Medicine

## 2012-04-27 ENCOUNTER — Ambulatory Visit (INDEPENDENT_AMBULATORY_CARE_PROVIDER_SITE_OTHER): Payer: BC Managed Care – PPO | Admitting: Internal Medicine

## 2012-04-27 VITALS — BP 110/70 | HR 80 | Temp 97.9°F | Resp 20 | Ht 62.0 in | Wt 152.0 lb

## 2012-04-27 DIAGNOSIS — Z0000 Encounter for general adult medical examination without abnormal findings: Secondary | ICD-10-CM

## 2012-04-27 DIAGNOSIS — I10 Essential (primary) hypertension: Secondary | ICD-10-CM

## 2012-04-27 MED ORDER — LISINOPRIL-HYDROCHLOROTHIAZIDE 10-12.5 MG PO TABS: 1.0000 | ORAL_TABLET | Freq: Every day | ORAL | Status: DC

## 2012-04-27 NOTE — Patient Instructions (Signed)
Limit your sodium (Salt) intake    It is important that you exercise regularly, at least 20 minutes 3 to 4 times per week.  If you develop chest pain or shortness of breath seek  medical attention.  Please check your blood pressure on a regular basis.  If it is consistently greater than 150/90, please make an office appointment.  Return in one year for follow-up  

## 2012-04-27 NOTE — Progress Notes (Signed)
Patient ID: Brandy Todd, female   DOB: 15-Jul-1961, 51 y.o.   MRN: [id number]  Subjective:    Patient ID: Brandy Todd, female    DOB: 1961/04/30, 51 y.o.   MRN: [id number]  Wt Readings from Last 3 Encounters:  04/27/12 152 lb (68.947 kg)  01/18/12 148 lb (67.132 kg)  01/04/12 148 lb 3.2 oz (67.223 kg)    Hypertension Pertinent negatives include no chest pain, headaches, palpitations or shortness of breath.   History of Present Illness:   51  year-old patient who is seen today for follow-up. She has a history of hypertension and does monitor blood pressures carefully at home. She continues to lose weight. Blood pressure readings at home and also by GYN and allergy have been well controlled. No concerns or complaints today. Her allergy and asthma symptoms, stable   Current Medications (verified):  1) Zyrtec Allergy 10 Mg Tabs (Cetirizine Hcl) .Marland Kitchen.. 1 Once Daily  2) Cvs Omeprazole 20 Mg Tbec (Omeprazole) .Marland Kitchen.. 1 Once Daily  3) Asmanex 30 Metered Doses 110 Mcg/inh Aepb (Mometasone Furoate) .... 2 Puffs Once Daily  4) Lisinopril-Hydrochlorothiazide 10-12.5 Mg Tabs (Lisinopril-Hydrochlorothiazide) .... One Daily   Allergies (verified):  No Known Drug Allergies   Past History:  Past Medical History:  Reviewed history from 09/12/2009 and no changes required.  Asthma  Anxiety  GERD  Allergic rhinitis  Hypertension- January 2011   Past Surgical History:  Reviewed history from 01/23/2008 and no changes required.  gravida two, para two, aborta zero    Review of Systems  Constitutional: Negative for fever, appetite change, fatigue and unexpected weight change.  HENT: Negative for hearing loss, ear pain, nosebleeds, congestion, sore throat, mouth sores, trouble swallowing, neck stiffness, dental problem, voice change, sinus pressure and tinnitus.   Eyes: Negative for photophobia, pain, redness and visual disturbance.  Respiratory: Negative for cough, chest tightness and shortness of  breath.   Cardiovascular: Negative for chest pain, palpitations and leg swelling.  Gastrointestinal: Negative for nausea, vomiting, abdominal pain, diarrhea, constipation, blood in stool, abdominal distention and rectal pain.  Genitourinary: Negative for dysuria, urgency, frequency, hematuria, flank pain, vaginal bleeding, vaginal discharge, difficulty urinating, genital sores, vaginal pain, menstrual problem and pelvic pain.  Musculoskeletal: Negative for back pain and arthralgias.  Skin: Negative for rash.  Neurological: Negative for dizziness, syncope, speech difficulty, weakness, light-headedness, numbness and headaches.  Hematological: Negative for adenopathy. Does not bruise/bleed easily.  Psychiatric/Behavioral: Negative for suicidal ideas, behavioral problems, self-injury, dysphoric mood and agitation. The patient is not nervous/anxious.        Objective:   Physical Exam  Constitutional: She is oriented to person, place, and time. She appears well-developed and well-nourished.  HENT:  Head: Normocephalic and atraumatic.  Right Ear: External ear normal.  Left Ear: External ear normal.  Mouth/Throat: Oropharynx is clear and moist.  Eyes: Conjunctivae and EOM are normal.  Neck: Normal range of motion. Neck supple. No JVD present. No thyromegaly present.  Cardiovascular: Normal rate, regular rhythm, normal heart sounds and intact distal pulses.   No murmur heard. Pulmonary/Chest: Effort normal and breath sounds normal. She has no wheezes. She has no rales.  Abdominal: Soft. Bowel sounds are normal. She exhibits no distension and no mass. There is no tenderness. There is no rebound and no guarding.  Musculoskeletal: Normal range of motion. She exhibits no edema and no tenderness.  Neurological: She is alert and oriented to person, place, and time. She has normal reflexes. No cranial nerve deficit. She  exhibits normal muscle tone. Coordination normal.  Skin: Skin is warm and dry. No  rash noted.  Psychiatric: She has a normal mood and affect. Her behavior is normal.          Assessment & Plan:      Preventive health examination. Laboratory studies were reviewed. The patient has been asked to moderate her sun exposure calcium and vitamin D supplements encouraged. She had a screening colonoscopy earlier this year. She has been asked to monitor home blood pressure readings and will recheck in one year. She'll continue her annual gynecologic evaluation

## 2012-08-30 HISTORY — PX: LASIK: SHX215

## 2013-03-15 ENCOUNTER — Encounter: Payer: Self-pay | Admitting: Internal Medicine

## 2013-03-15 ENCOUNTER — Ambulatory Visit (INDEPENDENT_AMBULATORY_CARE_PROVIDER_SITE_OTHER): Payer: PRIVATE HEALTH INSURANCE | Admitting: Internal Medicine

## 2013-03-15 VITALS — BP 110/70 | HR 82 | Temp 98.0°F | Resp 20 | Wt 156.0 lb

## 2013-03-15 DIAGNOSIS — K219 Gastro-esophageal reflux disease without esophagitis: Secondary | ICD-10-CM

## 2013-03-15 DIAGNOSIS — I10 Essential (primary) hypertension: Secondary | ICD-10-CM

## 2013-03-15 DIAGNOSIS — J45909 Unspecified asthma, uncomplicated: Secondary | ICD-10-CM

## 2013-03-15 MED ORDER — OMEPRAZOLE 20 MG PO CPDR: 20.0000 mg | DELAYED_RELEASE_CAPSULE | Freq: Every day | ORAL | Status: DC

## 2013-03-15 MED ORDER — MOMETASONE FUROATE 220 MCG/INH IN AEPB: 2.0000 | INHALATION_SPRAY | Freq: Every day | RESPIRATORY_TRACT | Status: DC

## 2013-03-15 MED ORDER — LISINOPRIL-HYDROCHLOROTHIAZIDE 10-12.5 MG PO TABS: 1.0000 | ORAL_TABLET | Freq: Every day | ORAL | Status: DC

## 2013-03-15 NOTE — Progress Notes (Signed)
Subjective:    Patient ID: Brandy Todd, female    DOB: 11-23-1960, 52 y.o.   MRN: [id number]  HPI  52 year old patient who is seen today for her annual evaluation. She was seen by OB/GYN last month and had a normal blood pressure. She feels quite well today. She has treated hypertension and history of mild asthma which has been well-controlled. No concerns or complaints today.  Past Medical History  Diagnosis Date  . ALLERGIC RHINITIS 01/23/2008  . ANXIETY 01/23/2008  . ASTHMA 01/23/2008  . GERD 01/23/2008  . HYPERTENSION 09/12/2009  . PARESTHESIA 08/20/2008  . Hot flashes, menopausal     History   Social History  . Marital Status: Married    Spouse Name: N/A    Number of Children: N/A  . Years of Education: N/A   Occupational History  . Not on file.   Social History Main Topics  . Smoking status: Former Smoker    Types: Cigarettes    Quit date: 08/30/1978  . Smokeless tobacco: Never Used  . Alcohol Use: 7.0 oz/week    14 drink(s) per week  . Drug Use: No  . Sexually Active: Not on file   Other Topics Concern  . Not on file   Social History Narrative  . No narrative on file    Past Surgical History  Procedure Laterality Date  . Lasik      Bil eyes    History reviewed. No pertinent family history.  No Known Allergies  Current Outpatient Prescriptions on File Prior to Visit  Medication Sig Dispense Refill  . BIOTIN PO Take by mouth daily.        . Calcium Carbonate-Vitamin D (CALCIUM 600+D) 600-200 MG-UNIT TABS Take 1 tablet by mouth daily. Take 500 mg daily      . cetirizine (KLS ALLER-TEC) 10 MG tablet Take 10 mg by mouth daily.       No current facility-administered medications on file prior to visit.    BP 110/70  Pulse 82  Temp(Src) 98 F (36.7 C) (Oral)  Resp 20  Wt 156 lb (70.761 kg)  BMI 28.53 kg/m2  LMP 10/16/2010       Review of Systems  Constitutional: Negative.   HENT: Negative for hearing loss, congestion, sore throat,  rhinorrhea, dental problem, sinus pressure and tinnitus.   Eyes: Negative for pain, discharge and visual disturbance.  Respiratory: Negative for cough and shortness of breath.   Cardiovascular: Negative for chest pain, palpitations and leg swelling.  Gastrointestinal: Negative for nausea, vomiting, abdominal pain, diarrhea, constipation, blood in stool and abdominal distention.  Genitourinary: Negative for dysuria, urgency, frequency, hematuria, flank pain, vaginal bleeding, vaginal discharge, difficulty urinating, vaginal pain and pelvic pain.  Musculoskeletal: Negative for joint swelling, arthralgias and gait problem.  Skin: Negative for rash.  Neurological: Negative for dizziness, syncope, speech difficulty, weakness, numbness and headaches.  Hematological: Negative for adenopathy.  Psychiatric/Behavioral: Negative for behavioral problems, dysphoric mood and agitation. The patient is not nervous/anxious.        Objective:   Physical Exam  Constitutional: She is oriented to person, place, and time. She appears well-developed and well-nourished.  HENT:  Head: Normocephalic.  Right Ear: External ear normal.  Left Ear: External ear normal.  Mouth/Throat: Oropharynx is clear and moist.  Eyes: Conjunctivae and EOM are normal. Pupils are equal, round, and reactive to light.  Neck: Normal range of motion. Neck supple. No thyromegaly present.  Cardiovascular: Normal rate, regular rhythm, normal heart sounds and  intact distal pulses.   Pulmonary/Chest: Effort normal and breath sounds normal.  Abdominal: Soft. Bowel sounds are normal. She exhibits no mass. There is no tenderness.  Musculoskeletal: Normal range of motion.  Lymphadenopathy:    She has no cervical adenopathy.  Neurological: She is alert and oriented to person, place, and time.  Skin: Skin is warm and dry. No rash noted.  Psychiatric: She has a normal mood and affect. Her behavior is normal.          Assessment & Plan:    Hypertension. Well controlled. We'll continue present regimen. Blood pressure low normal range today Asthma stable. We'll continue Asmanex  Recheck 1 year or when necessary All medications refilled

## 2013-03-15 NOTE — Patient Instructions (Signed)
Limit your sodium (Salt) intake    It is important that you exercise regularly, at least 20 minutes 3 to 4 times per week.  If you develop chest pain or shortness of breath seek  medical attention.  Please check your blood pressure on a regular basis.  If it is consistently greater than 150/90, please make an office appointment.  Return in one year for follow-up  

## 2013-08-20 ENCOUNTER — Ambulatory Visit (INDEPENDENT_AMBULATORY_CARE_PROVIDER_SITE_OTHER): Payer: PRIVATE HEALTH INSURANCE | Admitting: Internal Medicine

## 2013-08-20 ENCOUNTER — Encounter: Payer: Self-pay | Admitting: Internal Medicine

## 2013-08-20 VITALS — BP 110/80 | Temp 98.3°F | Wt 161.0 lb

## 2013-08-20 DIAGNOSIS — J45909 Unspecified asthma, uncomplicated: Secondary | ICD-10-CM

## 2013-08-20 DIAGNOSIS — I10 Essential (primary) hypertension: Secondary | ICD-10-CM

## 2013-08-20 DIAGNOSIS — F411 Generalized anxiety disorder: Secondary | ICD-10-CM

## 2013-08-20 DIAGNOSIS — J069 Acute upper respiratory infection, unspecified: Secondary | ICD-10-CM

## 2013-08-20 MED ORDER — HYDROCODONE-HOMATROPINE 5-1.5 MG/5ML PO SYRP: 5.0000 mL | ORAL_SOLUTION | Freq: Four times a day (QID) | ORAL | Status: AC | PRN

## 2013-08-20 NOTE — Progress Notes (Signed)
Pre visit review using our clinic review tool, if applicable. No additional management support is needed unless otherwise documented below in the visit note. 

## 2013-08-20 NOTE — Progress Notes (Signed)
Subjective:    Patient ID: Brandy Todd, female    DOB: Dec 03, 1960, 52 y.o.   MRN: [id number]  HPI Pre-visit discussion using our clinic review tool. No additional management support is needed unless otherwise documented below in the visit note.  52 year old patient who presents with a two-day history of cough sore throat postnasal drip. No fever or sputum production. She does have a history of mild asthma but no recent wheezing. She also has a history of allergic rhinitis.  Past Medical History  Diagnosis Date  . ALLERGIC RHINITIS 01/23/2008  . ANXIETY 01/23/2008  . ASTHMA 01/23/2008  . GERD 01/23/2008  . HYPERTENSION 09/12/2009  . PARESTHESIA 08/20/2008  . Hot flashes, menopausal     History   Social History  . Marital Status: Married    Spouse Name: N/A    Number of Children: N/A  . Years of Education: N/A   Occupational History  . Not on file.   Social History Main Topics  . Smoking status: Former Smoker    Types: Cigarettes    Quit date: 08/30/1978  . Smokeless tobacco: Never Used  . Alcohol Use: 7.0 oz/week    14 drink(s) per week  . Drug Use: No  . Sexual Activity: Not on file   Other Topics Concern  . Not on file   Social History Narrative  . No narrative on file    Past Surgical History  Procedure Laterality Date  . Lasik      Bil eyes    History reviewed. No pertinent family history.  No Known Allergies  Current Outpatient Prescriptions on File Prior to Visit  Medication Sig Dispense Refill  . BIOTIN PO Take by mouth daily.        . Calcium Carbonate-Vitamin D (CALCIUM 600+D) 600-200 MG-UNIT TABS Take 1 tablet by mouth daily. Take 500 mg daily      . cetirizine (KLS ALLER-TEC) 10 MG tablet Take 10 mg by mouth daily.      . Fiber, Guar Gum, CHEW Chew 2 each by mouth daily.      Marland Kitchen lisinopril-hydrochlorothiazide (ZESTORETIC) 10-12.5 MG per tablet Take 1 tablet by mouth daily.  90 tablet  3  . mometasone (ASMANEX) 220 MCG/INH inhaler Inhale 2 puffs  into the lungs daily.  1 Inhaler  6  . omeprazole (PRILOSEC) 20 MG capsule Take 1 capsule (20 mg total) by mouth daily.  90 capsule  3   No current facility-administered medications on file prior to visit.    BP 110/80  Temp(Src) 98.3 F (36.8 C) (Oral)  Wt 161 lb (73.029 kg)  LMP 10/16/2010       Review of Systems  Constitutional: Positive for activity change, appetite change and fatigue. Negative for fever, chills and diaphoresis.  HENT: Positive for postnasal drip, rhinorrhea and sore throat. Negative for congestion, dental problem, hearing loss, sinus pressure and tinnitus.   Eyes: Negative for pain, discharge and visual disturbance.  Respiratory: Positive for cough. Negative for shortness of breath.   Cardiovascular: Negative for chest pain, palpitations and leg swelling.  Gastrointestinal: Negative for nausea, vomiting, abdominal pain, diarrhea, constipation, blood in stool and abdominal distention.  Genitourinary: Negative for dysuria, urgency, frequency, hematuria, flank pain, vaginal bleeding, vaginal discharge, difficulty urinating, vaginal pain and pelvic pain.  Musculoskeletal: Negative for arthralgias, gait problem and joint swelling.  Skin: Negative for rash.  Neurological: Negative for dizziness, syncope, speech difficulty, weakness, numbness and headaches.  Hematological: Negative for adenopathy.  Psychiatric/Behavioral: Negative for  behavioral problems, dysphoric mood and agitation. The patient is not nervous/anxious.        Objective:   Physical Exam  Constitutional: She is oriented to person, place, and time. She appears well-developed and well-nourished.  HENT:  Head: Normocephalic.  Right Ear: External ear normal.  Left Ear: External ear normal.  Mild erythema of the oropharynx  Eyes: Conjunctivae and EOM are normal. Pupils are equal, round, and reactive to light.  Neck: Normal range of motion. Neck supple. No thyromegaly present.  Cardiovascular:  Normal rate, regular rhythm, normal heart sounds and intact distal pulses.   Pulmonary/Chest: Effort normal and breath sounds normal.  Abdominal: Soft. Bowel sounds are normal. She exhibits no mass. There is no tenderness.  Musculoskeletal: Normal range of motion.  Lymphadenopathy:    She has no cervical adenopathy.  Neurological: She is alert and oriented to person, place, and time.  Skin: Skin is warm and dry. No rash noted.  Psychiatric: She has a normal mood and affect. Her behavior is normal.          Assessment & Plan:   Viral URI with cough. Will treat symptomatically

## 2013-08-20 NOTE — Patient Instructions (Signed)
Acute bronchitis symptoms for less than 10 days are generally not helped by antibiotics.  Take over-the-counter expectorants and cough medications such as  Mucinex DM.  Call if there is no improvement in 5 to 7 days or if he developed worsening cough, fever, or new symptoms, such as shortness of breath or chest pain.    

## 2013-10-11 ENCOUNTER — Telehealth: Payer: Self-pay | Admitting: Internal Medicine

## 2013-10-11 NOTE — Telephone Encounter (Signed)
Pt would like dr k to accept her twin boys as new patients. Can I sch?Marland Kitchen

## 2013-10-11 NOTE — Telephone Encounter (Signed)
ok 

## 2013-10-11 NOTE — Telephone Encounter (Signed)
Dr. Raliegh Ip, the boys are 83 and need new provider.

## 2013-10-17 NOTE — Telephone Encounter (Signed)
lmom for pt to sch her son as new pts

## 2013-10-19 NOTE — Telephone Encounter (Signed)
lmom for pt to sch her sons for new pts appt

## 2013-10-22 NOTE — Telephone Encounter (Signed)
Pt mom will call to sch

## 2014-04-04 ENCOUNTER — Other Ambulatory Visit: Payer: Self-pay | Admitting: Internal Medicine

## 2014-04-16 ENCOUNTER — Other Ambulatory Visit (INDEPENDENT_AMBULATORY_CARE_PROVIDER_SITE_OTHER): Payer: PRIVATE HEALTH INSURANCE

## 2014-04-16 DIAGNOSIS — Z0000 Encounter for general adult medical examination without abnormal findings: Secondary | ICD-10-CM

## 2014-04-16 LAB — POCT URINALYSIS DIPSTICK
BILIRUBIN UA: NEGATIVE
Glucose, UA: NEGATIVE
Ketones, UA: NEGATIVE
Nitrite, UA: POSITIVE
PROTEIN UA: NEGATIVE
SPEC GRAV UA: 1.015
Urobilinogen, UA: 0.2
pH, UA: 5.5

## 2014-04-16 LAB — CBC WITH DIFFERENTIAL/PLATELET
BASOS ABS: 0 10*3/uL (ref 0.0–0.1)
BASOS PCT: 0.7 % (ref 0.0–3.0)
Eosinophils Absolute: 0.2 10*3/uL (ref 0.0–0.7)
Eosinophils Relative: 4.1 % (ref 0.0–5.0)
HCT: 40.2 % (ref 36.0–46.0)
HEMOGLOBIN: 13.3 g/dL (ref 12.0–15.0)
LYMPHS PCT: 31 % (ref 12.0–46.0)
Lymphs Abs: 1.5 10*3/uL (ref 0.7–4.0)
MCHC: 33 g/dL (ref 30.0–36.0)
MCV: 95.7 fl (ref 78.0–100.0)
MONOS PCT: 8.9 % (ref 3.0–12.0)
Monocytes Absolute: 0.4 10*3/uL (ref 0.1–1.0)
Neutro Abs: 2.7 10*3/uL (ref 1.4–7.7)
Neutrophils Relative %: 55.3 % (ref 43.0–77.0)
Platelets: 293 10*3/uL (ref 150.0–400.0)
RBC: 4.2 Mil/uL (ref 3.87–5.11)
RDW: 13.5 % (ref 11.5–15.5)
WBC: 4.8 10*3/uL (ref 4.0–10.5)

## 2014-04-16 LAB — LIPID PANEL
CHOL/HDL RATIO: 2
Cholesterol: 193 mg/dL (ref 0–200)
HDL: 92.6 mg/dL (ref >39.00)
LDL CALC: 81 mg/dL (ref 0–99)
NonHDL: 100.4
Triglycerides: 99 mg/dL (ref 0.0–149.0)
VLDL: 19.8 mg/dL (ref 0.0–40.0)

## 2014-04-16 LAB — HEPATIC FUNCTION PANEL
ALT: 16 U/L (ref 0–35)
AST: 21 U/L (ref 0–37)
Albumin: 3.9 g/dL (ref 3.5–5.2)
Alkaline Phosphatase: 59 U/L (ref 39–117)
BILIRUBIN DIRECT: 0.1 mg/dL (ref 0.0–0.3)
BILIRUBIN TOTAL: 0.7 mg/dL (ref 0.2–1.2)
TOTAL PROTEIN: 7.7 g/dL (ref 6.0–8.3)

## 2014-04-16 LAB — BASIC METABOLIC PANEL
BUN: 13 mg/dL (ref 6–23)
CHLORIDE: 102 meq/L (ref 96–112)
CO2: 27 meq/L (ref 19–32)
CREATININE: 0.9 mg/dL (ref 0.4–1.2)
Calcium: 9.4 mg/dL (ref 8.4–10.5)
GFR: 66.94 mL/min (ref >60.00)
Glucose, Bld: 89 mg/dL (ref 70–99)
Potassium: 3.8 mEq/L (ref 3.5–5.1)
Sodium: 139 mEq/L (ref 135–145)

## 2014-04-16 LAB — TSH: TSH: 2.75 u[IU]/mL (ref 0.35–4.50)

## 2014-04-26 ENCOUNTER — Encounter: Payer: Self-pay | Admitting: Internal Medicine

## 2014-04-26 ENCOUNTER — Ambulatory Visit (INDEPENDENT_AMBULATORY_CARE_PROVIDER_SITE_OTHER): Payer: PRIVATE HEALTH INSURANCE | Admitting: Internal Medicine

## 2014-04-26 VITALS — BP 135/84 | HR 88 | Temp 98.1°F | Resp 20 | Ht 62.5 in | Wt 158.0 lb

## 2014-04-26 DIAGNOSIS — I10 Essential (primary) hypertension: Secondary | ICD-10-CM

## 2014-04-26 DIAGNOSIS — Z0000 Encounter for general adult medical examination without abnormal findings: Secondary | ICD-10-CM

## 2014-04-26 MED ORDER — LISINOPRIL-HYDROCHLOROTHIAZIDE 10-12.5 MG PO TABS: 1.0000 | ORAL_TABLET | Freq: Every day | ORAL | Status: DC

## 2014-04-26 MED ORDER — MOMETASONE FUROATE 220 MCG/INH IN AEPB: INHALATION_SPRAY | RESPIRATORY_TRACT | Status: DC

## 2014-04-26 MED ORDER — OMEPRAZOLE 20 MG PO CPDR: 20.0000 mg | DELAYED_RELEASE_CAPSULE | Freq: Every day | ORAL | Status: DC

## 2014-04-26 NOTE — Progress Notes (Signed)
Subjective:    Patient ID: Brandy Todd, female    DOB: September 05, 1960, 53 y.o.   MRN: [id number]  HPI 53 year old patient who is seen today for her annual evaluation. She was seen by OB/GYN last month and had a normal blood pressure. She feels quite well today. She has treated hypertension and history of mild asthma which has been well-controlled. No concerns or complaints today.  BP Readings from Last 3 Encounters:  04/26/14 135/84  08/20/13 110/80  03/15/13 110/70     Past Medical History  Diagnosis Date  . ALLERGIC RHINITIS 01/23/2008  . ANXIETY 01/23/2008  . ASTHMA 01/23/2008  . GERD 01/23/2008  . HYPERTENSION 09/12/2009  . PARESTHESIA 08/20/2008  . Hot flashes, menopausal     History   Social History  . Marital Status: Married    Spouse Name: N/A    Number of Children: N/A  . Years of Education: N/A   Occupational History  . Not on file.   Social History Main Topics  . Smoking status: Former Smoker    Types: Cigarettes    Quit date: 08/30/1978  . Smokeless tobacco: Never Used  . Alcohol Use: 7.0 oz/week    14 drink(s) per week  . Drug Use: No  . Sexual Activity: Not on file   Other Topics Concern  . Not on file   Social History Narrative  . No narrative on file    Past Surgical History  Procedure Laterality Date  . Lasik      Bil eyes    No family history on file.  No Known Allergies  Current Outpatient Prescriptions on File Prior to Visit  Medication Sig Dispense Refill  . BIOTIN PO Take by mouth daily.        . Calcium Carbonate-Vitamin D (CALCIUM 600+D) 600-200 MG-UNIT TABS Take 1 tablet by mouth daily. Take 500 mg daily      . cetirizine (KLS ALLER-TEC) 10 MG tablet Take 10 mg by mouth daily.      . Fiber, Guar Gum, CHEW Chew 2 each by mouth daily.       No current facility-administered medications on file prior to visit.    BP 135/84  Pulse 88  Temp(Src) 98.1 F (36.7 C) (Oral)  Resp 20  Ht 5' 2.5" (1.588 m)  Wt 158 lb (71.668 kg)   BMI 28.42 kg/m2  LMP 10/16/2010       Review of Systems  Constitutional: Negative.   HENT: Negative for congestion, dental problem, hearing loss, rhinorrhea, sinus pressure, sore throat and tinnitus.   Eyes: Negative for pain, discharge and visual disturbance.  Respiratory: Negative for cough and shortness of breath.   Cardiovascular: Negative for chest pain, palpitations and leg swelling.  Gastrointestinal: Negative for nausea, vomiting, abdominal pain, diarrhea, constipation, blood in stool and abdominal distention.  Genitourinary: Negative for dysuria, urgency, frequency, hematuria, flank pain, vaginal bleeding, vaginal discharge, difficulty urinating, vaginal pain and pelvic pain.  Musculoskeletal: Negative for arthralgias, gait problem and joint swelling.  Skin: Negative for rash.  Neurological: Negative for dizziness, syncope, speech difficulty, weakness, numbness and headaches.  Hematological: Negative for adenopathy.  Psychiatric/Behavioral: Negative for behavioral problems, dysphoric mood and agitation. The patient is not nervous/anxious.        Objective:   Physical Exam  Constitutional: She is oriented to person, place, and time. She appears well-developed and well-nourished.  HENT:  Head: Normocephalic.  Right Ear: External ear normal.  Left Ear: External ear normal.  Mouth/Throat: Oropharynx is  clear and moist.  Eyes: Conjunctivae and EOM are normal. Pupils are equal, round, and reactive to light.  Neck: Normal range of motion. Neck supple. No thyromegaly present.  Cardiovascular: Normal rate, regular rhythm, normal heart sounds and intact distal pulses.   Pulmonary/Chest: Effort normal and breath sounds normal.  Abdominal: Soft. Bowel sounds are normal. She exhibits no mass. There is no tenderness.  Musculoskeletal: Normal range of motion.  Lymphadenopathy:    She has no cervical adenopathy.  Neurological: She is alert and oriented to person, place, and time.   Skin: Skin is warm and dry. No rash noted.  Psychiatric: She has a normal mood and affect. Her behavior is normal.          Assessment & Plan:   Hypertension. Well controlled. We'll continue present regimen. Blood pressure low normal range today Asthma stable. We'll continue Asmanex  Recheck 1 year or when necessary All medications refilled

## 2014-04-26 NOTE — Patient Instructions (Signed)
Please check your blood pressure on a regular basis.  If it is consistently greater than 150/90, please make an office appointment.  Limit your sodium (Salt) intake Health Maintenance Adopting a healthy lifestyle and getting preventive care can go a long way to promote health and wellness. Talk with your health care provider about what schedule of regular examinations is right for you. This is a good chance for you to check in with your provider about disease prevention and staying healthy. In between checkups, there are plenty of things you can do on your own. Experts have done a lot of research about which lifestyle changes and preventive measures are most likely to keep you healthy. Ask your health care provider for more information. WEIGHT AND DIET  Eat a healthy diet  Be sure to include plenty of vegetables, fruits, low-fat dairy products, and lean protein.  Do not eat a lot of foods high in solid fats, added sugars, or salt.  Get regular exercise. This is one of the most important things you can do for your health.  Most adults should exercise for at least 150 minutes each week. The exercise should increase your heart rate and make you sweat (moderate-intensity exercise).  Most adults should also do strengthening exercises at least twice a week. This is in addition to the moderate-intensity exercise.  Maintain a healthy weight  Body mass index (BMI) is a measurement that can be used to identify possible weight problems. It estimates body fat based on height and weight. Your health care provider can help determine your BMI and help you achieve or maintain a healthy weight.  For females 89 years of age and older:   A BMI below 18.5 is considered underweight.  A BMI of 18.5 to 24.9 is normal.  A BMI of 25 to 29.9 is considered overweight.  A BMI of 30 and above is considered obese.  Watch levels of cholesterol and blood lipids  You should start having your blood tested for  lipids and cholesterol at 53 years of age, then have this test every 5 years.  You may need to have your cholesterol levels checked more often if:  Your lipid or cholesterol levels are high.  You are older than 53 years of age.  You are at high risk for heart disease.  CANCER SCREENING   Lung Cancer  Lung cancer screening is recommended for adults 37-7 years old who are at high risk for lung cancer because of a history of smoking.  A yearly low-dose CT scan of the lungs is recommended for people who:  Currently smoke.  Have quit within the past 15 years.  Have at least a 30-pack-year history of smoking. A pack year is smoking an average of one pack of cigarettes a day for 1 year.  Yearly screening should continue until it has been 15 years since you quit.  Yearly screening should stop if you develop a health problem that would prevent you from having lung cancer treatment.  Breast Cancer  Practice breast self-awareness. This means understanding how your breasts normally appear and feel.  It also means doing regular breast self-exams. Let your health care provider know about any changes, no matter how small.  If you are in your 20s or 30s, you should have a clinical breast exam (CBE) by a health care provider every 1-3 years as part of a regular health exam.  If you are 39 or older, have a CBE every year. Also consider having a  breast X-ray (mammogram) every year.  If you have a family history of breast cancer, talk to your health care provider about genetic screening.  If you are at high risk for breast cancer, talk to your health care provider about having an MRI and a mammogram every year.  Breast cancer gene (BRCA) assessment is recommended for women who have family members with BRCA-related cancers. BRCA-related cancers include:  Breast.  Ovarian.  Tubal.  Peritoneal cancers.  Results of the assessment will determine the need for genetic counseling and BRCA1  and BRCA2 testing. Cervical Cancer Routine pelvic examinations to screen for cervical cancer are no longer recommended for nonpregnant women who are considered low risk for cancer of the pelvic organs (ovaries, uterus, and vagina) and who do not have symptoms. A pelvic examination may be necessary if you have symptoms including those associated with pelvic infections. Ask your health care provider if a screening pelvic exam is right for you.   The Pap test is the screening test for cervical cancer for women who are considered at risk.  If you had a hysterectomy for a problem that was not cancer or a condition that could lead to cancer, then you no longer need Pap tests.  If you are older than 65 years, and you have had normal Pap tests for the past 10 years, you no longer need to have Pap tests.  If you have had past treatment for cervical cancer or a condition that could lead to cancer, you need Pap tests and screening for cancer for at least 20 years after your treatment.  If you no longer get a Pap test, assess your risk factors if they change (such as having a new sexual partner). This can affect whether you should start being screened again.  Some women have medical problems that increase their chance of getting cervical cancer. If this is the case for you, your health care provider may recommend more frequent screening and Pap tests.  The human papillomavirus (HPV) test is another test that may be used for cervical cancer screening. The HPV test looks for the virus that can cause cell changes in the cervix. The cells collected during the Pap test can be tested for HPV.  The HPV test can be used to screen women 72 years of age and older. Getting tested for HPV can extend the interval between normal Pap tests from three to five years.  An HPV test also should be used to screen women of any age who have unclear Pap test results.  After 53 years of age, women should have HPV testing as often  as Pap tests.  Colorectal Cancer  This type of cancer can be detected and often prevented.  Routine colorectal cancer screening usually begins at 53 years of age and continues through 53 years of age.  Your health care provider may recommend screening at an earlier age if you have risk factors for colon cancer.  Your health care provider may also recommend using home test kits to check for hidden blood in the stool.  A small camera at the end of a tube can be used to examine your colon directly (sigmoidoscopy or colonoscopy). This is done to check for the earliest forms of colorectal cancer.  Routine screening usually begins at age 16.  Direct examination of the colon should be repeated every 5-10 years through 53 years of age. However, you may need to be screened more often if early forms of precancerous polyps or  small growths are found. Skin Cancer  Check your skin from head to toe regularly.  Tell your health care provider about any new moles or changes in moles, especially if there is a change in a mole's shape or color.  Also tell your health care provider if you have a mole that is larger than the size of a pencil eraser.  Always use sunscreen. Apply sunscreen liberally and repeatedly throughout the day.  Protect yourself by wearing long sleeves, pants, a wide-brimmed hat, and sunglasses whenever you are outside. HEART DISEASE, DIABETES, AND HIGH BLOOD PRESSURE   Have your blood pressure checked at least every 1-2 years. High blood pressure causes heart disease and increases the risk of stroke.  If you are between 57 years and 55 years old, ask your health care provider if you should take aspirin to prevent strokes.  Have regular diabetes screenings. This involves taking a blood sample to check your fasting blood sugar level.  If you are at a normal weight and have a low risk for diabetes, have this test once every three years after 53 years of age.  If you are overweight  and have a high risk for diabetes, consider being tested at a younger age or more often. PREVENTING INFECTION  Hepatitis B  If you have a higher risk for hepatitis B, you should be screened for this virus. You are considered at high risk for hepatitis B if:  You were born in a country where hepatitis B is common. Ask your health care provider which countries are considered high risk.  Your parents were born in a high-risk country, and you have not been immunized against hepatitis B (hepatitis B vaccine).  You have HIV or AIDS.  You use needles to inject street drugs.  You live with someone who has hepatitis B.  You have had sex with someone who has hepatitis B.  You get hemodialysis treatment.  You take certain medicines for conditions, including cancer, organ transplantation, and autoimmune conditions. Hepatitis C  Blood testing is recommended for:  Everyone born from [birth year] through 1965.  Anyone with known risk factors for hepatitis C. Sexually transmitted infections (STIs)  You should be screened for sexually transmitted infections (STIs) including gonorrhea and chlamydia if:  You are sexually active and are younger than 53 years of age.  You are older than 53 years of age and your health care provider tells you that you are at risk for this type of infection.  Your sexual activity has changed since you were last screened and you are at an increased risk for chlamydia or gonorrhea. Ask your health care provider if you are at risk.  If you do not have HIV, but are at risk, it may be recommended that you take a prescription medicine daily to prevent HIV infection. This is called pre-exposure prophylaxis (PrEP). You are considered at risk if:  You are sexually active and do not regularly use condoms or know the HIV status of your partner(s).  You take drugs by injection.  You are sexually active with a partner who has HIV. Talk with your health care provider about whether  you are at high risk of being infected with HIV. If you choose to begin PrEP, you should first be tested for HIV. You should then be tested every 3 months for as long as you are taking PrEP.  PREGNANCY   If you are premenopausal and you may become pregnant, ask your health care provider about preconception  counseling.  If you may become pregnant, take 400 to 800 micrograms (mcg) of folic acid every day.  If you want to prevent pregnancy, talk to your health care provider about birth control (contraception). OSTEOPOROSIS AND MENOPAUSE   Osteoporosis is a disease in which the bones lose minerals and strength with aging. This can result in serious bone fractures. Your risk for osteoporosis can be identified using a bone density scan.  If you are 32 years of age or older, or if you are at risk for osteoporosis and fractures, ask your health care provider if you should be screened.  Ask your health care provider whether you should take a calcium or vitamin D supplement to lower your risk for osteoporosis.  Menopause may have certain physical symptoms and risks.  Hormone replacement therapy may reduce some of these symptoms and risks. Talk to your health care provider about whether hormone replacement therapy is right for you.  HOME CARE INSTRUCTIONS   Schedule regular health, dental, and eye exams.  Stay current with your immunizations.   Do not use any tobacco products including cigarettes, chewing tobacco, or electronic cigarettes.  If you are pregnant, do not drink alcohol.  If you are breastfeeding, limit how much and how often you drink alcohol.  Limit alcohol intake to no more than 1 drink per day for nonpregnant women. One drink equals 12 ounces of beer, 5 ounces of wine, or 1 ounces of hard liquor.  Do not use street drugs.  Do not share needles.  Ask your health care provider for help if you need support or information about quitting drugs.  Tell your health care  provider if you often feel depressed.  Tell your health care provider if you have ever been abused or do not feel safe at home. Document Released: 03/01/2011 Document Revised: 12/31/2013 Document Reviewed: 07/18/2013 [facility] Patient Information 2015 [place], [place]. This information is not intended to replace advice given to you by your health care provider. Make sure you discuss any questions you have with your health care provider.

## 2014-04-26 NOTE — Progress Notes (Signed)
Pre visit review using our clinic review tool, if applicable. No additional management support is needed unless otherwise documented below in the visit note. 

## 2015-01-20 ENCOUNTER — Telehealth: Payer: Self-pay | Admitting: Family Medicine

## 2015-01-20 NOTE — Telephone Encounter (Signed)
June 2015 was last mammogram and she is scheduled for 01/2015 at [facility].

## 2015-01-20 NOTE — Telephone Encounter (Signed)
Left a message for a return call.  Need to see when pt had last mammogram.

## 2015-01-21 NOTE — Telephone Encounter (Signed)
Noted  

## 2015-03-11 ENCOUNTER — Other Ambulatory Visit: Payer: Self-pay | Admitting: Obstetrics and Gynecology

## 2015-03-12 LAB — CYTOLOGY - PAP

## 2015-03-14 ENCOUNTER — Other Ambulatory Visit: Payer: Self-pay | Admitting: Obstetrics and Gynecology

## 2015-03-14 DIAGNOSIS — R928 Other abnormal and inconclusive findings on diagnostic imaging of breast: Secondary | ICD-10-CM

## 2015-03-21 ENCOUNTER — Ambulatory Visit
Admission: RE | Admit: 2015-03-21 | Discharge: 2015-03-21 | Disposition: A | Discharge status: Home / Self Care | Payer: PRIVATE HEALTH INSURANCE | Source: Ambulatory Visit | Attending: Obstetrics and Gynecology | Admitting: Obstetrics and Gynecology

## 2015-03-21 DIAGNOSIS — R928 Other abnormal and inconclusive findings on diagnostic imaging of breast: Secondary | ICD-10-CM

## 2015-03-21 LAB — HM MAMMOGRAPHY

## 2015-04-04 ENCOUNTER — Encounter: Payer: Self-pay | Admitting: *Deleted

## 2015-05-25 ENCOUNTER — Other Ambulatory Visit: Payer: Self-pay | Admitting: Internal Medicine

## 2015-08-12 ENCOUNTER — Other Ambulatory Visit (INDEPENDENT_AMBULATORY_CARE_PROVIDER_SITE_OTHER): Payer: PRIVATE HEALTH INSURANCE

## 2015-08-12 DIAGNOSIS — Z0000 Encounter for general adult medical examination without abnormal findings: Secondary | ICD-10-CM

## 2015-08-12 LAB — POCT URINALYSIS DIPSTICK
Bilirubin, UA: NEGATIVE
Glucose, UA: NEGATIVE
KETONES UA: NEGATIVE
Leukocytes, UA: NEGATIVE
Nitrite, UA: NEGATIVE
PH UA: 5.5
PROTEIN UA: NEGATIVE
SPEC GRAV UA: 1.025
UROBILINOGEN UA: 0.2

## 2015-08-12 LAB — BASIC METABOLIC PANEL
BUN: 16 mg/dL (ref 6–23)
CO2: 28 mEq/L (ref 19–32)
CREATININE: 1.09 mg/dL (ref 0.40–1.20)
Calcium: 9.4 mg/dL (ref 8.4–10.5)
Chloride: 99 mEq/L (ref 96–112)
GFR: 55.46 mL/min — ABNORMAL LOW (ref >60.00)
Glucose, Bld: 100 mg/dL — ABNORMAL HIGH (ref 70–99)
Potassium: 4 mEq/L (ref 3.5–5.1)
Sodium: 136 mEq/L (ref 135–145)

## 2015-08-12 LAB — CBC WITH DIFFERENTIAL/PLATELET
BASOS PCT: 1 % (ref 0.0–3.0)
Basophils Absolute: 0.1 10*3/uL (ref 0.0–0.1)
EOS ABS: 0.3 10*3/uL (ref 0.0–0.7)
EOS PCT: 6 % — AB (ref 0.0–5.0)
HEMATOCRIT: 41 % (ref 36.0–46.0)
Hemoglobin: 13.8 g/dL (ref 12.0–15.0)
LYMPHS PCT: 28.4 % (ref 12.0–46.0)
Lymphs Abs: 1.6 10*3/uL (ref 0.7–4.0)
MCHC: 33.8 g/dL (ref 30.0–36.0)
MCV: 93.2 fl (ref 78.0–100.0)
Monocytes Absolute: 0.4 10*3/uL (ref 0.1–1.0)
Monocytes Relative: 7.4 % (ref 3.0–12.0)
NEUTROS ABS: 3.3 10*3/uL (ref 1.4–7.7)
Neutrophils Relative %: 57.2 % (ref 43.0–77.0)
PLATELETS: 375 10*3/uL (ref 150.0–400.0)
RBC: 4.4 Mil/uL (ref 3.87–5.11)
RDW: 13.1 % (ref 11.5–15.5)
WBC: 5.7 10*3/uL (ref 4.0–10.5)

## 2015-08-12 LAB — HEPATIC FUNCTION PANEL
ALT: 16 U/L (ref 0–35)
AST: 18 U/L (ref 0–37)
Albumin: 4.3 g/dL (ref 3.5–5.2)
Alkaline Phosphatase: 71 U/L (ref 39–117)
Bilirubin, Direct: 0.1 mg/dL (ref 0.0–0.3)
TOTAL PROTEIN: 7.4 g/dL (ref 6.0–8.3)
Total Bilirubin: 0.6 mg/dL (ref 0.2–1.2)

## 2015-08-12 LAB — LIPID PANEL
CHOLESTEROL: 181 mg/dL (ref 0–200)
HDL: 76 mg/dL (ref >39.00)
LDL Cholesterol: 67 mg/dL (ref 0–99)
NONHDL: 105.37
Total CHOL/HDL Ratio: 2
Triglycerides: 191 mg/dL — ABNORMAL HIGH (ref 0.0–149.0)
VLDL: 38.2 mg/dL (ref 0.0–40.0)

## 2015-08-12 LAB — TSH: TSH: 3.15 u[IU]/mL (ref 0.35–4.50)

## 2015-08-19 ENCOUNTER — Encounter: Payer: Self-pay | Admitting: Internal Medicine

## 2015-08-19 ENCOUNTER — Other Ambulatory Visit: Payer: Self-pay | Admitting: *Deleted

## 2015-08-19 ENCOUNTER — Ambulatory Visit (INDEPENDENT_AMBULATORY_CARE_PROVIDER_SITE_OTHER): Payer: PRIVATE HEALTH INSURANCE | Admitting: Internal Medicine

## 2015-08-19 VITALS — BP 128/80 | HR 70 | Temp 98.5°F | Resp 20 | Ht 61.75 in | Wt 168.0 lb

## 2015-08-19 DIAGNOSIS — Z0000 Encounter for general adult medical examination without abnormal findings: Secondary | ICD-10-CM

## 2015-08-19 DIAGNOSIS — I10 Essential (primary) hypertension: Secondary | ICD-10-CM

## 2015-08-19 DIAGNOSIS — F411 Generalized anxiety disorder: Secondary | ICD-10-CM

## 2015-08-19 DIAGNOSIS — J4520 Mild intermittent asthma, uncomplicated: Secondary | ICD-10-CM

## 2015-08-19 MED ORDER — OMEPRAZOLE 20 MG PO CPDR: 20.0000 mg | DELAYED_RELEASE_CAPSULE | Freq: Every day | ORAL | Status: DC

## 2015-08-19 MED ORDER — MOMETASONE FUROATE 220 MCG/INH IN AEPB: INHALATION_SPRAY | RESPIRATORY_TRACT | Status: DC

## 2015-08-19 MED ORDER — LISINOPRIL-HYDROCHLOROTHIAZIDE 10-12.5 MG PO TABS: 1.0000 | ORAL_TABLET | Freq: Every day | ORAL | Status: DC

## 2015-08-19 MED ORDER — ALBUTEROL SULFATE 108 (90 BASE) MCG/ACT IN AEPB: 2.0000 | INHALATION_SPRAY | Freq: Four times a day (QID) | RESPIRATORY_TRACT | Status: DC | PRN

## 2015-08-19 NOTE — Progress Notes (Signed)
Subjective:    Patient ID: [name], female    DOB: 28-Oct-1960, 54 y.o.   MRN: [id number]  HPI   Subjective:    Patient ID: MAKIAH MCALOON, female    DOB: 04-14-1961, 54 y.o.   MRN: [id number]  HPI 54 year-old patient who is seen today for her annual evaluation.  She is followed annually by OB/GYN. She feels quite well today. She has treated hypertension and history of mild asthma which has been well-controlled. No concerns or complaints today.  BP Readings from Last 3 Encounters:  04/26/14 135/84  08/20/13 110/80  03/15/13 110/70     Past Medical History  Diagnosis Date  . ALLERGIC RHINITIS 01/23/2008  . ANXIETY 01/23/2008  . ASTHMA 01/23/2008  . GERD 01/23/2008  . HYPERTENSION 09/12/2009  . PARESTHESIA 08/20/2008  . Hot flashes, menopausal     Social History   Social History  . Marital Status: Married    Spouse Name: N/A  . Number of Children: N/A  . Years of Education: N/A   Occupational History  . Not on file.   Social History Main Topics  . Smoking status: Former Smoker    Types: Cigarettes    Quit date: 08/30/1978  . Smokeless tobacco: Never Used  . Alcohol Use: 7.0 oz/week    14 drink(s) per week  . Drug Use: No  . Sexual Activity: Not on file   Other Topics Concern  . Not on file   Social History Narrative  . No narrative on file    Past Surgical History  Procedure Laterality Date  . Lasik      Bil eyes    No family history on file.  No Known Allergies  Current Outpatient Prescriptions on File Prior to Visit  Medication Sig Dispense Refill  . BIOTIN PO Take by mouth daily.      . Calcium Carbonate-Vitamin D (CALCIUM 600+D) 600-200 MG-UNIT TABS Take 1 tablet by mouth daily. Take 500 mg daily    . cetirizine (KLS ALLER-TEC) 10 MG tablet Take 10 mg by mouth daily.    . Fiber, Guar Gum, CHEW Chew 2 each by mouth daily.    Marland Kitchen lisinopril-hydrochlorothiazide (PRINZIDE,ZESTORETIC) 10-12.5 MG per tablet TAKE 1 TABLET ONCE DAILY. 90 tablet 0  .  mometasone (ASMANEX 60 METERED DOSES) 220 MCG/INH inhaler USE 2 PUFFS ONCE DAILY. 1 Inhaler 12  . omeprazole (PRILOSEC) 20 MG capsule Take 1 capsule (20 mg total) by mouth daily. 90 capsule 3   No current facility-administered medications on file prior to visit.    LMP 10/16/2010   Colonoscopy 2013    Review of Systems  Constitutional: Negative.   HENT: Negative for congestion, dental problem, hearing loss, rhinorrhea, sinus pressure, sore throat and tinnitus.   Eyes: Negative for pain, discharge and visual disturbance.  Respiratory: Negative for cough and shortness of breath.   Cardiovascular: Negative for chest pain, palpitations and leg swelling.  Gastrointestinal: Negative for nausea, vomiting, abdominal pain, diarrhea, constipation, blood in stool and abdominal distention.  Genitourinary: Negative for dysuria, urgency, frequency, hematuria, flank pain, vaginal bleeding, vaginal discharge, difficulty urinating, vaginal pain and pelvic pain.  Musculoskeletal: Negative for arthralgias, gait problem and joint swelling.  Skin: Negative for rash.  Neurological: Negative for dizziness, syncope, speech difficulty, weakness, numbness and headaches.  Hematological: Negative for adenopathy.  Psychiatric/Behavioral: Negative for behavioral problems, dysphoric mood and agitation. The patient is not nervous/anxious.        Objective:   Physical Exam  Constitutional: She is oriented to person, place, and time. She appears well-developed and well-nourished.  HENT:  Head: Normocephalic.  Right Ear: External ear normal.  Left Ear: External ear normal.  Mouth/Throat: Oropharynx is clear and moist.  Eyes: Conjunctivae and EOM are normal. Pupils are equal, round, and reactive to light.  Neck: Normal range of motion. Neck supple. No thyromegaly present.  Cardiovascular: Normal rate, regular rhythm, normal heart sounds and intact distal pulses.   Pulmonary/Chest: Effort normal and breath sounds  normal.  Abdominal: Soft. Bowel sounds are normal. She exhibits no mass. There is no tenderness.  Musculoskeletal: Normal range of motion.  Lymphadenopathy:    She has no cervical adenopathy.  Neurological: She is alert and oriented to person, place, and time.  Skin: Skin is warm and dry. No rash noted.  Psychiatric: She has a normal mood and affect. Her behavior is normal.          Assessment & Plan:   Hypertension. Well controlled. We'll continue present regimen.  Asthma stable. We'll continue Asmanex  Recheck 1 year or when necessary All medications refilled  Review of Systems     Objective:   Physical Exam        Assessment & Plan:

## 2015-08-19 NOTE — Patient Instructions (Signed)

## 2015-12-11 ENCOUNTER — Telehealth: Payer: Self-pay | Admitting: Internal Medicine

## 2015-12-11 NOTE — Telephone Encounter (Signed)
Pt said the following med has went up   mometasone (ASMANEX 60 METERED DOSES) 220 MCG/INH inhaler  She is asking if there is alternate or coupons

## 2015-12-11 NOTE — Telephone Encounter (Signed)
Spoke to pt, told her I do have a coupon for her I will put it at the front desk. Told pt need to contact your insurance and see what alternative is available that is cheaper and then we can change it for you. Pt verbalized understanding and will be by today to pickup coupon.

## 2016-05-17 ENCOUNTER — Encounter (HOSPITAL_COMMUNITY): Payer: Self-pay | Admitting: Emergency Medicine

## 2016-05-17 ENCOUNTER — Emergency Department (HOSPITAL_COMMUNITY)
Admission: EM | Admit: 2016-05-17 | Discharge: 2016-05-18 | Disposition: A | Discharge status: Home / Self Care | Payer: No Typology Code available for payment source | Attending: Emergency Medicine | Admitting: Emergency Medicine

## 2016-05-17 DIAGNOSIS — J45909 Unspecified asthma, uncomplicated: Secondary | ICD-10-CM | POA: Insufficient documentation

## 2016-05-17 DIAGNOSIS — I10 Essential (primary) hypertension: Secondary | ICD-10-CM | POA: Insufficient documentation

## 2016-05-17 DIAGNOSIS — Z87891 Personal history of nicotine dependence: Secondary | ICD-10-CM | POA: Insufficient documentation

## 2016-05-17 DIAGNOSIS — R197 Diarrhea, unspecified: Secondary | ICD-10-CM | POA: Diagnosis not present

## 2016-05-17 DIAGNOSIS — R1013 Epigastric pain: Secondary | ICD-10-CM | POA: Diagnosis not present

## 2016-05-17 DIAGNOSIS — Z79899 Other long term (current) drug therapy: Secondary | ICD-10-CM | POA: Diagnosis not present

## 2016-05-17 DIAGNOSIS — R1011 Right upper quadrant pain: Secondary | ICD-10-CM

## 2016-05-17 LAB — COMPREHENSIVE METABOLIC PANEL
ALT: 23 U/L (ref 14–54)
AST: 27 U/L (ref 15–41)
Albumin: 4.5 g/dL (ref 3.5–5.0)
Alkaline Phosphatase: 70 U/L (ref 38–126)
Anion gap: 12 (ref 5–15)
BUN: 25 mg/dL — ABNORMAL HIGH (ref 6–20)
CO2: 23 mmol/L (ref 22–32)
Calcium: 9.3 mg/dL (ref 8.9–10.3)
Chloride: 101 mmol/L (ref 101–111)
Creatinine, Ser: 1.07 mg/dL — ABNORMAL HIGH (ref 0.44–1.00)
GFR calc Af Amer: >60 mL/min (ref >60)
GFR, EST NON AFRICAN AMERICAN: 57 mL/min — AB (ref >60)
Glucose, Bld: 103 mg/dL — ABNORMAL HIGH (ref 65–99)
POTASSIUM: 3.8 mmol/L (ref 3.5–5.1)
SODIUM: 136 mmol/L (ref 135–145)
Total Bilirubin: 0.9 mg/dL (ref 0.3–1.2)
Total Protein: 8.5 g/dL — ABNORMAL HIGH (ref 6.5–8.1)

## 2016-05-17 LAB — CBC
HEMATOCRIT: 40.4 % (ref 36.0–46.0)
Hemoglobin: 13.5 g/dL (ref 12.0–15.0)
MCH: 31.5 pg (ref 26.0–34.0)
MCHC: 33.4 g/dL (ref 30.0–36.0)
MCV: 94.2 fL (ref 78.0–100.0)
Platelets: 334 10*3/uL (ref 150–400)
RBC: 4.29 MIL/uL (ref 3.87–5.11)
RDW: 12.5 % (ref 11.5–15.5)
WBC: 7.6 10*3/uL (ref 4.0–10.5)

## 2016-05-17 LAB — LIPASE, BLOOD: Lipase: 38 U/L (ref 11–51)

## 2016-05-17 MED ORDER — GI COCKTAIL ~~LOC~~
30.0000 mL | Freq: Once | ORAL | Status: AC
  Administered 2016-05-18: 30 mL via ORAL
  Filled 2016-05-17: qty 30

## 2016-05-17 NOTE — ED Triage Notes (Signed)
Pt c/o dull mid upper abdominal pain sudden onset at 4 pm today. Pt states it came out of no where with no build up. Pt denies N/V/D. States "I feel like if I burped or pooped it would go away, but I did both and neither helped." Pt took Tums before coming.

## 2016-05-17 NOTE — ED Provider Notes (Signed)
[facility] DEPT Provider Note   CSN: [id number] Arrival date & time: 05/17/16  1911   By signing my name below, I, Royce Macadamia, attest that this documentation has been prepared under the direction and in the presence of Veryl Speak, MD . Electronically Signed: Royce Macadamia, Scribe. 05/17/2016. 11:44 PM.  History   Chief Complaint Chief Complaint  Patient presents with  . Abdominal Pain   The history is provided by the patient and medical records. No language interpreter was used.    HPI Comments:  Brandy Todd is a 55 y.o. female with a PmHx of GERD who presents to the Emergency Department complaining of dull persistent medial upper abdominal pain beginning at 1600 today.  She states that it began suddenly while she was at work.  She had eaten chicken noodle soup for lunch and chips at 1400.  She notes some associated diarrhea.   Yesterday she went to a panther's game and drank about 2 bottles of wine over an 8 hour period.  She is currently taking Omeprazole.  She denies PmHx of cholecystectomy, appendectomy, abdominal surgery and abdominal ulcer.  She denies back pain.    12:36 AM  Pt states that her symptoms are improved after a GI cocktail.    Past Medical History:  Diagnosis Date  . ALLERGIC RHINITIS 01/23/2008  . ANXIETY 01/23/2008  . ASTHMA 01/23/2008  . GERD 01/23/2008  . Hot flashes, menopausal   . HYPERTENSION 09/12/2009  . PARESTHESIA 08/20/2008    Patient Active Problem List   Diagnosis Date Noted  . Essential hypertension 09/12/2009  . PARESTHESIA 08/20/2008  . Anxiety state 01/23/2008  . ALLERGIC RHINITIS 01/23/2008  . Asthma 01/23/2008  . GERD 01/23/2008    Past Surgical History:  Procedure Laterality Date  . LASIK     Bil eyes    OB History    No data available       Home Medications    Prior to Admission medications   Medication Sig Start Date End Date Taking? Authorizing Provider  albuterol (PROVENTIL HFA;VENTOLIN HFA) 108 (90  BASE) MCG/ACT inhaler Inhale 2 puffs into the lungs every 6 (six) hours as needed for wheezing or shortness of breath.    Historical Provider, MD  Albuterol Sulfate (PROAIR RESPICLICK) 123XX123 (90 BASE) MCG/ACT AEPB Inhale 2 puffs into the lungs 4 (four) times daily as needed. 08/19/15   Marletta Lor, MD  BIOTIN PO Take by mouth daily.      Historical Provider, MD  Calcium Carbonate-Vitamin D (CALCIUM 600+D) 600-200 MG-UNIT TABS Take 1 tablet by mouth daily. Take 500 mg daily    Historical Provider, MD  cetirizine (KLS ALLER-TEC) 10 MG tablet Take 10 mg by mouth daily.    Historical Provider, MD  Fiber, Guar Gum, CHEW Chew 2 each by mouth daily.    Historical Provider, MD  lisinopril-hydrochlorothiazide (PRINZIDE,ZESTORETIC) 10-12.5 MG tablet Take 1 tablet by mouth daily. 08/19/15   Marletta Lor, MD  mometasone (ASMANEX 60 METERED DOSES) 220 MCG/INH inhaler USE 2 PUFFS ONCE DAILY. 08/19/15   Marletta Lor, MD  omeprazole (PRILOSEC) 20 MG capsule Take 1 capsule (20 mg total) by mouth daily. 08/19/15   Marletta Lor, MD    Family History No family history on file.  Social History Social History  Substance Use Topics  . Smoking status: Former Smoker    Types: Cigarettes    Quit date: 08/30/1978  . Smokeless tobacco: Never Used  . Alcohol use 7.0 oz/week  14 Standard drinks or equivalent per week     Allergies   Review of patient's allergies indicates no known allergies.   Review of Systems Review of Systems  Gastrointestinal: Positive for abdominal pain and diarrhea.  Musculoskeletal: Negative for back pain.  All other systems reviewed and are negative.    Physical Exam Updated Vital Signs BP 146/78 (BP Location: Left Arm)   Pulse 89   Temp 98 F (36.7 C) (Oral)   Resp 18   Ht 5\' 2"  (1.575 m)   Wt 160 lb (72.6 kg)   LMP 10/16/2010   SpO2 100%   BMI 29.26 kg/m   Physical Exam  Constitutional: She is oriented to person, place, and time. She  appears well-developed.  HENT:  Head: Normocephalic.  Eyes: Conjunctivae and EOM are normal. No scleral icterus.  Neck: Neck supple. No thyromegaly present.  Cardiovascular: Normal rate and regular rhythm.  Exam reveals no gallop and no friction rub.   No murmur heard. Pulmonary/Chest: No stridor. She has no wheezes. She has no rales. She exhibits no tenderness.  Abdominal: Soft. Bowel sounds are normal. She exhibits no distension. There is tenderness. There is no rebound.  Mild TTP in the epigastric region.    Musculoskeletal: Normal range of motion. She exhibits no edema.  Lymphadenopathy:    She has no cervical adenopathy.  Neurological: She is oriented to person, place, and time. She exhibits normal muscle tone. Coordination normal.  Skin: No rash noted. No erythema.  Psychiatric: She has a normal mood and affect. Her behavior is normal.     ED Treatments / Results   DIAGNOSTIC STUDIES:  Oxygen Saturation is 100% on RA, NML by my interpretation.    COORDINATION OF CARE:  11:44 PM Discussed treatment plan with pt at bedside and pt agreed to plan.  Labs (all labs ordered are listed, but only abnormal results are displayed) Labs Reviewed  COMPREHENSIVE METABOLIC PANEL - Abnormal; Notable for the following:       Result Value   Glucose, Bld 103 (*)    BUN 25 (*)    Creatinine, Ser 1.07 (*)    Total Protein 8.5 (*)    GFR calc non Af Amer 57 (*)    All other components within normal limits  URINALYSIS, ROUTINE W REFLEX MICROSCOPIC (NOT AT [facility]) - Abnormal; Notable for the following:    Hgb urine dipstick TRACE (*)    All other components within normal limits  URINE MICROSCOPIC-ADD ON - Abnormal; Notable for the following:    Squamous Epithelial / LPF 0-5 (*)    Bacteria, UA RARE (*)    All other components within normal limits  LIPASE, BLOOD  CBC    EKG  EKG Interpretation None       Radiology US Abdomen Limited Ruq  Result Date: 05/18/2016 CLINICAL DATA:   Right upper quadrant pain since 1600 hours yesterday. EXAM: US ABDOMEN LIMITED - RIGHT UPPER QUADRANT COMPARISON:  None. FINDINGS: Gallbladder: No gallstones or wall thickening visualized. No sonographic Murphy sign noted by sonographer. Common bile duct: Diameter: 5.6 mm, normal Liver: Diffusely increased hepatic parenchymal echotexture suggesting diffuse fatty infiltration. No focal liver lesions identified. IMPRESSION: Diffuse fatty infiltration of the liver. No evidence of cholelithiasis or cholecystitis. Electronically Signed   By: Lucienne Capers M.D.   On: 05/18/2016 01:24    Procedures Procedures (including critical care time)  Medications Ordered in ED Medications  gi cocktail (Maalox,Lidocaine,Donnatal) (30 mLs Oral Given 05/18/16 0017)  Initial Impression / Assessment and Plan / ED Course  I have reviewed the triage vital signs and the nursing notes.  Pertinent labs & imaging results that were available during my care of the patient were reviewed by me and considered in my medical decision making (see chart for details).  Clinical Course    Patient presents for evaluation of upper abdominal discomfort which started earlier this evening. She reports drinking several bottles of wine the day before while attending a football game. Her laboratory studies are reassuring. There is no evidence for hepatitis, pancreatitis. Ultrasound reveals no evidence for cholecystitis. She got some relief with a GI cocktail. I highly suspect some sort of alcohol-induced gastritis. She will be treated with protonix and when necessary return.  Final Clinical Impressions(s) / ED Diagnoses   Final diagnoses:  RUQ pain    New Prescriptions New Prescriptions   No medications on file   I personally performed the services described in this documentation, which was scribed in my presence. The recorded information has been reviewed and is accurate.       Veryl Speak, MD 05/18/16 [phone]

## 2016-05-17 NOTE — ED Notes (Signed)
Dr. Stark Jock in with pt.

## 2016-05-18 ENCOUNTER — Telehealth: Payer: Self-pay | Admitting: *Deleted

## 2016-05-18 ENCOUNTER — Emergency Department (HOSPITAL_COMMUNITY): Payer: No Typology Code available for payment source

## 2016-05-18 LAB — URINALYSIS, ROUTINE W REFLEX MICROSCOPIC
Bilirubin Urine: NEGATIVE
Glucose, UA: NEGATIVE mg/dL
Ketones, ur: NEGATIVE mg/dL
LEUKOCYTES UA: NEGATIVE
NITRITE: NEGATIVE
PH: 5 (ref 5.0–8.0)
Protein, ur: NEGATIVE mg/dL
Specific Gravity, Urine: 1.017 (ref 1.005–1.030)

## 2016-05-18 LAB — URINE MICROSCOPIC-ADD ON

## 2016-05-18 MED ORDER — PANTOPRAZOLE SODIUM 20 MG PO TBEC: 20.0000 mg | DELAYED_RELEASE_TABLET | Freq: Every day | ORAL | 0 refills | Status: DC

## 2016-05-18 NOTE — Discharge Instructions (Signed)
Protonix as prescribed.  Stop taking your omeprazole until after the course of Protonix is complete.  Return to the emergency department if you develop worsening pain, high fevers, bloody stools, or other new and concerning symptoms.

## 2016-05-18 NOTE — Telephone Encounter (Signed)
Patient seen in ED  --------------------------------------------------------------- PLEASE NOTE: All timestamps contained within this report are represented as [place] Standard Time. CONFIDENTIALTY NOTICE: This fax transmission is intended only for the addressee. It contains information that is legally privileged, confidential or otherwise protected from use or disclosure. If you are not the intended recipient, you are strictly prohibited from reviewing, disclosing, copying using or disseminating any of this information or taking any action in reliance on or regarding this information. If you have received this fax in error, please notify us immediately by telephone so that we can arrange for its return to [place]. Phone: [phone], Toll-Free: [phone], Fax: [phone] Page: 1 of 2 Call Id: [id number] [facility] Night - Client [place] Patient Name: Brandy Todd Gender: Female DOB: 06-16-1961 Age: 55 Y 37 M 7 D Return Phone Number: [phone] (Primary), [phone] (Secondary) Address: City/State/Zip: [place] Client [facility] Night - Client Client Site [facility] - Night Physician Simonne Martinet - MD Contact Type Call Who Is Calling Patient / Member / Family / Caregiver Call Type Triage / Clinical Relationship To Patient Self Return Phone Number [phone] (Primary) Chief Complaint Abdominal Pain Reason for Call Symptomatic / Request for Pence is having sharp abdominal pain. The pain is constant. The pain is right below the rib cage. Pain is about a 4 on scale 1-10. PreDisposition Go to Urgent Care/Walk-In Clinic Translation No Nurse Assessment Nurse: Mills-Hernandez, RN, Izora Gala Date/Time (Eastern Time): 05/17/2016 6:50:34 PM Confirm and document reason for call. If symptomatic, describe symptoms. You must click the next button to save  text entered. ---Caller is having sharp abdominal pain. that started around 4:00 p.m. today The pain is constant. The pain is right below the rib cage in the middle of abdomen. Pain is about a 4 on scale 1-10. Has the patient traveled out of the country within the last 30 days? ---No Does the patient have any new or worsening symptoms? ---Yes Will a triage be completed? ---Yes Related visit to physician within the last 2 weeks? ---No Does the PT have any chronic conditions? (i.e. diabetes, asthma, etc.) ---Yes List chronic conditions. ---HTN, Allergies and Asthma that is allergy induced Is this a behavioral health or substance abuse call? ---No Guidelines Guideline Title Affirmed Question Affirmed Notes Nurse Date/Time Eilene Ghazi Time) Abdominal Pain - Upper [1] Pain lasts > 10 minutes AND [2] age > 81 Mills-Hernandez, RN, Izora Gala 05/17/2016 6:52:57 PM Disp. Time Eilene Ghazi Time) Disposition Final User 05/17/2016 6:55:39 PM Go to ED Now Yes Mills-Hernandez, RN, Izora Gala PLEASE NOTE: All timestamps contained within this report are represented as [place] Standard Time. CONFIDENTIALTY NOTICE: This fax transmission is intended only for the addressee. It contains information that is legally privileged, confidential or otherwise protected from use or disclosure. If you are not the intended recipient, you are strictly prohibited from reviewing, disclosing, copying using or disseminating any of this information or taking any action in reliance on or regarding this information. If you have received this fax in error, please notify us immediately by telephone so that we can arrange for its return to [place]. Phone: [phone], Toll-Free: [phone], Fax: [phone] Page: 2 of 2 Call Id: [id number] Caller Understands: Yes Disagree/Comply: Comply Care Advice Given Per Guideline GO TO ED NOW: You need to be seen in the Emergency Department. Go to the ER at ___________ [place] now.  Drive carefully. DRIVING: Another adult should drive. Do not delay going  to the Emergency Department. If immediate transportation is not available via car or taxi, then the patient should be instructed to call [phone]. BRING MEDICINES: * It is also a good idea to bring the pill bottles too. This will help the doctor to make certain you are taking the right medicines and the right dose. * Please bring a list of your current medicines when you go to the Emergency Department (ER). * Confusion occurs CALL EMS 911 IF: * Severe difficulty breathing occurs. * Passes out or becomes too weak to stand CARE ADVICE given per Abdominal Pain, Upper (Adult) guideline. Referrals [facility] - ED

## 2016-05-21 ENCOUNTER — Other Ambulatory Visit: Payer: Self-pay | Admitting: Internal Medicine

## 2016-05-24 ENCOUNTER — Ambulatory Visit (INDEPENDENT_AMBULATORY_CARE_PROVIDER_SITE_OTHER): Payer: PRIVATE HEALTH INSURANCE | Admitting: Internal Medicine

## 2016-05-24 ENCOUNTER — Encounter: Payer: Self-pay | Admitting: Internal Medicine

## 2016-05-24 VITALS — BP 132/80 | HR 76 | Temp 98.3°F | Resp 20 | Ht 62.0 in | Wt 164.0 lb

## 2016-05-24 DIAGNOSIS — K219 Gastro-esophageal reflux disease without esophagitis: Secondary | ICD-10-CM | POA: Diagnosis not present

## 2016-05-24 DIAGNOSIS — I10 Essential (primary) hypertension: Secondary | ICD-10-CM | POA: Diagnosis not present

## 2016-05-24 MED ORDER — PANTOPRAZOLE SODIUM 20 MG PO TBEC
20.0000 mg | DELAYED_RELEASE_TABLET | Freq: Every day | ORAL | 3 refills | Status: DC
Start: 2016-05-24 — End: 2016-05-24

## 2016-05-24 MED ORDER — LISINOPRIL-HYDROCHLOROTHIAZIDE 10-12.5 MG PO TABS: 1.0000 | ORAL_TABLET | Freq: Every day | ORAL | 3 refills | Status: DC

## 2016-05-24 MED ORDER — PANTOPRAZOLE SODIUM 20 MG PO TBEC
20.0000 mg | DELAYED_RELEASE_TABLET | Freq: Every day | ORAL | 3 refills | Status: DC
Start: 2016-05-24 — End: 2017-05-25

## 2016-05-24 MED ORDER — ALBUTEROL SULFATE HFA 108 (90 BASE) MCG/ACT IN AERS: 2.0000 | INHALATION_SPRAY | Freq: Four times a day (QID) | RESPIRATORY_TRACT | 6 refills | Status: DC | PRN

## 2016-05-24 MED ORDER — PANTOPRAZOLE SODIUM 20 MG PO TBEC
20.0000 mg | DELAYED_RELEASE_TABLET | Freq: Every day | ORAL | 0 refills | Status: DC
Start: 2016-05-24 — End: 2016-05-24

## 2016-05-24 MED ORDER — MOMETASONE FUROATE 220 MCG/INH IN AEPB: INHALATION_SPRAY | RESPIRATORY_TRACT | 12 refills | Status: DC

## 2016-05-24 NOTE — Progress Notes (Signed)
Pre visit review using our clinic review tool, if applicable. No additional management support is needed unless otherwise documented below in the visit note. 

## 2016-05-24 NOTE — Patient Instructions (Signed)
Limit your sodium (Salt) intake  Avoids foods high in acid such as tomatoes citrus juices, and spicy foods.  Avoid eating within two hours of lying down or before exercising.  Do not overheat.  Try smaller more frequent meals.

## 2016-05-24 NOTE — Progress Notes (Signed)
Subjective:    Patient ID: Brandy Todd, female    DOB: Nov 01, 1960, 55 y.o.   MRN: [id number]  HPI  55 year old patient who has essential hypertension.  She also has a history of allergic rhinitis, asthma and gastroesophageal reflux disease.  She was seen in the ED.  7 days ago with severe dyspepsia.  She responded well to a GI cocktail.  Abdominal ultrasound was negative for cholecystitis or stones She has done well over the past week.  She was placed on a bland diet, which she followed for 3 days and switch to Protonix.  Past Medical History:  Diagnosis Date  . ALLERGIC RHINITIS 01/23/2008  . ANXIETY 01/23/2008  . ASTHMA 01/23/2008  . GERD 01/23/2008  . Hot flashes, menopausal   . HYPERTENSION 09/12/2009  . PARESTHESIA 08/20/2008     Social History   Social History  . Marital status: Married    Spouse name: N/A  . Number of children: N/A  . Years of education: N/A   Occupational History  . Not on file.   Social History Main Topics  . Smoking status: Former Smoker    Types: Cigarettes    Quit date: 08/30/1978  . Smokeless tobacco: Never Used  . Alcohol use 7.0 oz/week    14 Standard drinks or equivalent per week  . Drug use: No  . Sexual activity: Not on file   Other Topics Concern  . Not on file   Social History Narrative  . No narrative on file    Past Surgical History:  Procedure Laterality Date  . LASIK     Bil eyes    No family history on file.  No Known Allergies  Current Outpatient Prescriptions on File Prior to Visit  Medication Sig Dispense Refill  . albuterol (PROVENTIL HFA;VENTOLIN HFA) 108 (90 BASE) MCG/ACT inhaler Inhale 2 puffs into the lungs every 6 (six) hours as needed for wheezing or shortness of breath.    . Albuterol Sulfate (PROAIR RESPICLICK) 123XX123 (90 BASE) MCG/ACT AEPB Inhale 2 puffs into the lungs 4 (four) times daily as needed. 1 each 1  . BIOTIN PO Take by mouth daily.      . Calcium Carbonate-Vitamin D (CALCIUM 600+D) 600-200  MG-UNIT TABS Take 1 tablet by mouth daily. Take 500 mg daily    . cetirizine (KLS ALLER-TEC) 10 MG tablet Take 10 mg by mouth daily.    . Fiber, Guar Gum, CHEW Chew 2 each by mouth daily.    Marland Kitchen lisinopril-hydrochlorothiazide (PRINZIDE,ZESTORETIC) 10-12.5 MG tablet Take 1 tablet by mouth daily. 90 tablet 3  . mometasone (ASMANEX 60 METERED DOSES) 220 MCG/INH inhaler USE 2 PUFFS ONCE DAILY. 1 Inhaler 12  . pantoprazole (PROTONIX) 20 MG tablet Take 1 tablet (20 mg total) by mouth daily. 15 tablet 0   No current facility-administered medications on file prior to visit.     BP 132/80 (BP Location: Left Arm, Patient Position: Sitting, Cuff Size: Normal)   Pulse 76   Temp 98.3 F (36.8 C) (Oral)   Resp 20   Ht 5\' 2"  (1.575 m)   Wt 164 lb (74.4 kg)   LMP 10/16/2010   BMI 30.00 kg/m     Review of Systems  Constitutional: Negative.   HENT: Negative for congestion, dental problem, hearing loss, rhinorrhea, sinus pressure, sore throat and tinnitus.   Eyes: Negative for pain, discharge and visual disturbance.  Respiratory: Negative for cough and shortness of breath.   Cardiovascular: Negative for chest pain, palpitations  and leg swelling.  Gastrointestinal: Positive for abdominal pain. Negative for abdominal distention, blood in stool, constipation, diarrhea, nausea and vomiting.  Genitourinary: Negative for difficulty urinating, dysuria, flank pain, frequency, hematuria, pelvic pain, urgency, vaginal bleeding, vaginal discharge and vaginal pain.  Musculoskeletal: Negative for arthralgias, gait problem and joint swelling.  Skin: Negative for rash.  Neurological: Negative for dizziness, syncope, speech difficulty, weakness, numbness and headaches.  Hematological: Negative for adenopathy.  Psychiatric/Behavioral: Negative for agitation, behavioral problems and dysphoric mood. The patient is not nervous/anxious.        Objective:   Physical Exam  Constitutional: She is oriented to person,  place, and time. She appears well-developed and well-nourished.  HENT:  Head: Normocephalic.  Right Ear: External ear normal.  Left Ear: External ear normal.  Mouth/Throat: Oropharynx is clear and moist.  Eyes: Conjunctivae and EOM are normal. Pupils are equal, round, and reactive to light.  Neck: Normal range of motion. Neck supple. No thyromegaly present.  Cardiovascular: Normal rate, regular rhythm, normal heart sounds and intact distal pulses.   Pulmonary/Chest: Effort normal and breath sounds normal.  Abdominal: Soft. Bowel sounds are normal. She exhibits no distension and no mass. There is no tenderness. There is no rebound and no guarding.  No epigastric or right upper quadrant tenderness  Musculoskeletal: Normal range of motion.  Lymphadenopathy:    She has no cervical adenopathy.  Neurological: She is alert and oriented to person, place, and time.  Skin: Skin is warm and dry. No rash noted.  Psychiatric: She has a normal mood and affect. Her behavior is normal.          Assessment & Plan:   Gastroesophageal reflux disease.  This has been stable over the past week.  Will continue Protonix.  We'll continue anti-reflex measures Essential hypertension, stable   Asthma, allergic rhinitis  Medications updated  Recheck 6 months  Brandy Todd Brandy Plate

## 2016-12-14 ENCOUNTER — Telehealth: Payer: Self-pay

## 2016-12-14 NOTE — Telephone Encounter (Signed)
Received PA request for Asmanex [place]. PA submitted & approved. Form faxed back to pharmacy.

## 2017-03-28 ENCOUNTER — Telehealth: Payer: Self-pay

## 2017-03-28 NOTE — Telephone Encounter (Signed)
Received PA request for Asmanex. PA submitted & pending. Key: Gabriela Eves

## 2017-04-08 MED ORDER — BUDESONIDE 90 MCG/ACT IN AEPB
2.0000 | INHALATION_SPRAY | Freq: Two times a day (BID) | RESPIRATORY_TRACT | 3 refills | Status: DC
Start: 2017-04-08 — End: 2018-01-31

## 2017-04-08 NOTE — Addendum Note (Signed)
Addended by: Abelardo Diesel on: 04/08/2017 03:22 PM   Modules accepted: Orders

## 2017-04-08 NOTE — Telephone Encounter (Signed)
Pulmicort 2 puffs once daily

## 2017-04-08 NOTE — Telephone Encounter (Signed)
PA denied. Formulary alternatives are: Arnuity Ellipta, Flovent, or Pulmicort Flexhaler.

## 2017-04-08 NOTE — Telephone Encounter (Signed)
Please advise 

## 2017-05-05 ENCOUNTER — Encounter: Payer: Self-pay | Admitting: Family Medicine

## 2017-05-25 ENCOUNTER — Other Ambulatory Visit: Payer: Self-pay | Admitting: Internal Medicine

## 2017-06-14 ENCOUNTER — Encounter: Payer: Self-pay | Admitting: Internal Medicine

## 2017-06-14 ENCOUNTER — Ambulatory Visit (INDEPENDENT_AMBULATORY_CARE_PROVIDER_SITE_OTHER): Payer: No Typology Code available for payment source | Admitting: Internal Medicine

## 2017-06-14 VITALS — BP 112/70 | HR 95 | Temp 98.3°F | Ht 62.0 in | Wt 171.2 lb

## 2017-06-14 DIAGNOSIS — Z0000 Encounter for general adult medical examination without abnormal findings: Secondary | ICD-10-CM | POA: Diagnosis not present

## 2017-06-14 LAB — CBC WITH DIFFERENTIAL/PLATELET
BASOS ABS: 0.1 10*3/uL (ref 0.0–0.1)
Basophils Relative: 1.7 % (ref 0.0–3.0)
EOS PCT: 5 % (ref 0.0–5.0)
Eosinophils Absolute: 0.2 10*3/uL (ref 0.0–0.7)
HCT: 38.8 % (ref 36.0–46.0)
HEMOGLOBIN: 12.9 g/dL (ref 12.0–15.0)
LYMPHS ABS: 1.3 10*3/uL (ref 0.7–4.0)
Lymphocytes Relative: 39.3 % (ref 12.0–46.0)
MCHC: 33.3 g/dL (ref 30.0–36.0)
MCV: 95.7 fl (ref 78.0–100.0)
MONO ABS: 0.4 10*3/uL (ref 0.1–1.0)
MONOS PCT: 11 % (ref 3.0–12.0)
NEUTROS PCT: 43 % (ref 43.0–77.0)
Neutro Abs: 1.5 10*3/uL (ref 1.4–7.7)
Platelets: 314 10*3/uL (ref 150.0–400.0)
RBC: 4.05 Mil/uL (ref 3.87–5.11)
RDW: 12.8 % (ref 11.5–15.5)
WBC: 3.4 10*3/uL — AB (ref 4.0–10.5)

## 2017-06-14 LAB — LIPID PANEL
Cholesterol: 179 mg/dL (ref 0–200)
HDL: 78.2 mg/dL (ref >39.00)
NONHDL: 100.31
TRIGLYCERIDES: 206 mg/dL — AB (ref 0.0–149.0)
Total CHOL/HDL Ratio: 2
VLDL: 41.2 mg/dL — ABNORMAL HIGH (ref 0.0–40.0)

## 2017-06-14 LAB — COMPREHENSIVE METABOLIC PANEL
ALT: 24 U/L (ref 0–35)
AST: 28 U/L (ref 0–37)
Albumin: 4.1 g/dL (ref 3.5–5.2)
Alkaline Phosphatase: 67 U/L (ref 39–117)
BUN: 16 mg/dL (ref 6–23)
CO2: 26 mEq/L (ref 19–32)
Calcium: 9.2 mg/dL (ref 8.4–10.5)
Chloride: 105 mEq/L (ref 96–112)
Creatinine, Ser: 0.93 mg/dL (ref 0.40–1.20)
GFR: 66.16 mL/min (ref >60.00)
Glucose, Bld: 95 mg/dL (ref 70–99)
Potassium: 4.3 mEq/L (ref 3.5–5.1)
Sodium: 142 mEq/L (ref 135–145)
Total Bilirubin: 0.4 mg/dL (ref 0.2–1.2)
Total Protein: 7.1 g/dL (ref 6.0–8.3)

## 2017-06-14 LAB — TSH: TSH: 3.03 u[IU]/mL (ref 0.35–4.50)

## 2017-06-14 LAB — LDL CHOLESTEROL, DIRECT: LDL DIRECT: 70 mg/dL

## 2017-06-14 NOTE — Progress Notes (Signed)
Subjective:    Patient ID: Brandy Todd, female    DOB: 04-Dec-1960, 56 y.o.   MRN: [id number]  HPI  56 year old patient who is seen today for a preventive health examination She has had a recent gynecologic follow-up with Pap and mammogram and breast exam She has a history of essential hypertension and asthma, which have been stable. No concerns or complaints  Family history noncontributory.  Father age 32.  History of coronary artery disease.  Mother age 40 in good health.  One brother with hypertension.  2 sisters are well  Past Medical History:  Diagnosis Date  . ALLERGIC RHINITIS 01/23/2008  . ANXIETY 01/23/2008  . ASTHMA 01/23/2008  . GERD 01/23/2008  . Hot flashes, menopausal   . HYPERTENSION 09/12/2009  . PARESTHESIA 08/20/2008     Social History   Social History  . Marital status: Married    Spouse name: N/A  . Number of children: N/A  . Years of education: N/A   Occupational History  . Not on file.   Social History Main Topics  . Smoking status: Former Smoker    Types: Cigarettes    Quit date: 08/30/1978  . Smokeless tobacco: Never Used  . Alcohol use 7.0 oz/week    14 Standard drinks or equivalent per week  . Drug use: No  . Sexual activity: Not on file   Other Topics Concern  . Not on file   Social History Narrative  . No narrative on file    Past Surgical History:  Procedure Laterality Date  . LASIK     Bil eyes    No family history on file.  No Known Allergies  Current Outpatient Prescriptions on File Prior to Visit  Medication Sig Dispense Refill  . Albuterol Sulfate (PROAIR RESPICLICK) 678 (90 BASE) MCG/ACT AEPB Inhale 2 puffs into the lungs 4 (four) times daily as needed. 1 each 1  . BIOTIN PO Take by mouth daily.      . Budesonide (PULMICORT FLEXHALER) 90 MCG/ACT inhaler Inhale 2 puffs into the lungs 2 (two) times daily. 1 Inhaler 3  . Calcium Carbonate-Vitamin D (CALCIUM 600+D) 600-200 MG-UNIT TABS Take 1 tablet by mouth daily. Take 500  mg daily    . cetirizine (KLS ALLER-TEC) 10 MG tablet Take 10 mg by mouth daily.    . Fiber, Guar Gum, CHEW Chew 2 each by mouth daily.    Marland Kitchen lisinopril-hydrochlorothiazide (PRINZIDE,ZESTORETIC) 10-12.5 MG tablet Take 1 tablet by mouth daily. 90 tablet 3  . pantoprazole (PROTONIX) 20 MG tablet TAKE 1 TABLET EACH DAY. 90 tablet 0   No current facility-administered medications on file prior to visit.     BP 112/70 (BP Location: Left Arm, Patient Position: Sitting, Cuff Size: Normal)   Pulse 95   Temp 98.3 F (36.8 C) (Oral)   Ht 5\' 2"  (1.575 m)   Wt 171 lb 3.2 oz (77.7 kg)   LMP 10/13/2011   SpO2 98%   BMI 31.31 kg/m     Review of Systems  Constitutional: Negative.   HENT: Negative for congestion, dental problem, hearing loss, rhinorrhea, sinus pressure, sore throat and tinnitus.   Eyes: Negative for pain, discharge and visual disturbance.  Respiratory: Negative for cough and shortness of breath.   Cardiovascular: Negative for chest pain, palpitations and leg swelling.  Gastrointestinal: Negative for abdominal distention, abdominal pain, blood in stool, constipation, diarrhea, nausea and vomiting.  Genitourinary: Negative for difficulty urinating, dysuria, flank pain, frequency, hematuria, pelvic pain, urgency, vaginal  bleeding, vaginal discharge and vaginal pain.  Musculoskeletal: Negative for arthralgias, gait problem and joint swelling.  Skin: Negative for rash.  Neurological: Negative for dizziness, syncope, speech difficulty, weakness, numbness and headaches.  Hematological: Negative for adenopathy.  Psychiatric/Behavioral: Negative for agitation, behavioral problems and dysphoric mood. The patient is not nervous/anxious.        Objective:   Physical Exam  Constitutional: She is oriented to person, place, and time. She appears well-developed and well-nourished.  Weight 171 Blood pressure 112/70  HENT:  Head: Normocephalic.  Right Ear: External ear normal.  Left Ear:  External ear normal.  Mouth/Throat: Oropharynx is clear and moist.  Eyes: Pupils are equal, round, and reactive to light. Conjunctivae and EOM are normal.  Neck: Normal range of motion. Neck supple. No thyromegaly present.  Cardiovascular: Normal rate, regular rhythm, normal heart sounds and intact distal pulses.   Pulmonary/Chest: Effort normal and breath sounds normal.  Abdominal: Soft. Bowel sounds are normal. She exhibits no mass. There is no tenderness.  Musculoskeletal: Normal range of motion.  Lymphadenopathy:    She has no cervical adenopathy.  Neurological: She is alert and oriented to person, place, and time.  Skin: Skin is warm and dry. No rash noted.  Psychiatric: She has a normal mood and affect. Her behavior is normal.          Assessment & Plan:   Preventive health examination Essential hypertension, well-controlled Asthma, stable Gastroesophageal reflux disease  We'll check updated lab Follow-up one year  Nyoka Cowden

## 2017-06-14 NOTE — Patient Instructions (Signed)
Limit your sodium (Salt) intake    It is important that you exercise regularly, at least 20 minutes 3 to 4 times per week.  If you develop chest pain or shortness of breath seek  medical attention.  You need to lose weight.  Consider a lower calorie diet and regular exercise.  Please check your blood pressure on a regular basis.  If it is consistently greater than 140/90, please make an office appointment.  Return in one year for follow-up

## 2017-06-21 ENCOUNTER — Other Ambulatory Visit: Payer: Self-pay | Admitting: Internal Medicine

## 2017-08-26 ENCOUNTER — Other Ambulatory Visit: Payer: Self-pay | Admitting: Internal Medicine

## 2017-08-30 DIAGNOSIS — C44621 Squamous cell carcinoma of skin of unspecified upper limb, including shoulder: Secondary | ICD-10-CM

## 2017-08-30 HISTORY — DX: Squamous cell carcinoma of skin of unspecified upper limb, including shoulder: C44.621

## 2017-09-27 ENCOUNTER — Other Ambulatory Visit: Payer: Self-pay | Admitting: Internal Medicine

## 2017-12-19 ENCOUNTER — Other Ambulatory Visit: Payer: Self-pay | Admitting: Internal Medicine

## 2018-01-30 ENCOUNTER — Telehealth: Payer: Self-pay | Admitting: Internal Medicine

## 2018-01-30 NOTE — Telephone Encounter (Signed)
Copied from [place] [id number]. Topic: Quick Communication - See Telephone Encounter >> Jan 30, 2018  9:47 AM Conception Chancy, NT wrote: CRM for notification. See Telephone encounter for: 01/30/18.  Patient is calling and states she is going to need refills on the following medications in July. She had a physical in Oct and states these medications should last her a year.   Budesonide (PULMICORT FLEXHALER) 90 MCG/ACT inhaler  lisinopril-hydrochlorothiazide (PRINZIDE,ZESTORETIC) 10-12.5 MG tablet  pantoprazole (PROTONIX) 20 MG tablet   [place], [place] [address] Phone: [phone] Fax: [phone]

## 2018-01-31 ENCOUNTER — Other Ambulatory Visit: Payer: Self-pay | Admitting: *Deleted

## 2018-01-31 MED ORDER — BUDESONIDE 90 MCG/ACT IN AEPB: 2.0000 | INHALATION_SPRAY | Freq: Two times a day (BID) | RESPIRATORY_TRACT | 3 refills | Status: DC

## 2018-01-31 MED ORDER — LISINOPRIL-HYDROCHLOROTHIAZIDE 10-12.5 MG PO TABS: ORAL_TABLET | ORAL | 0 refills | Status: DC

## 2018-01-31 MED ORDER — PANTOPRAZOLE SODIUM 20 MG PO TBEC: DELAYED_RELEASE_TABLET | ORAL | 0 refills | Status: DC

## 2018-01-31 NOTE — Telephone Encounter (Signed)
Prescriptions refilled per protocol and last note- LOV: 05/2017

## 2018-03-15 ENCOUNTER — Encounter: Payer: Self-pay | Admitting: Internal Medicine

## 2018-03-15 ENCOUNTER — Ambulatory Visit (INDEPENDENT_AMBULATORY_CARE_PROVIDER_SITE_OTHER): Payer: PRIVATE HEALTH INSURANCE | Admitting: Internal Medicine

## 2018-03-15 VITALS — BP 90/60 | HR 96 | Temp 98.1°F | Wt 172.8 lb

## 2018-03-15 DIAGNOSIS — I10 Essential (primary) hypertension: Secondary | ICD-10-CM

## 2018-03-15 DIAGNOSIS — J4520 Mild intermittent asthma, uncomplicated: Secondary | ICD-10-CM | POA: Diagnosis not present

## 2018-03-15 MED ORDER — ALBUTEROL SULFATE 108 (90 BASE) MCG/ACT IN AEPB: 2.0000 | INHALATION_SPRAY | Freq: Four times a day (QID) | RESPIRATORY_TRACT | 4 refills | Status: DC | PRN

## 2018-03-15 MED ORDER — BUDESONIDE 90 MCG/ACT IN AEPB: 2.0000 | INHALATION_SPRAY | Freq: Two times a day (BID) | RESPIRATORY_TRACT | 3 refills | Status: DC

## 2018-03-15 MED ORDER — OMEPRAZOLE 20 MG PO CPDR: 20.0000 mg | DELAYED_RELEASE_CAPSULE | Freq: Every day | ORAL | 3 refills | Status: DC

## 2018-03-15 MED ORDER — LISINOPRIL-HYDROCHLOROTHIAZIDE 10-12.5 MG PO TABS: ORAL_TABLET | ORAL | 4 refills | Status: DC

## 2018-03-15 NOTE — Patient Instructions (Signed)
Limit your sodium (Salt) intake  Return in 6 months for follow-up  Please check your blood pressure on a regular basis.  If it is consistently greater than 140/90, please make an office appointment.

## 2018-03-15 NOTE — Progress Notes (Signed)
Subjective:    Patient ID: Brandy Todd, female    DOB: 1961/05/01, 57 y.o.   MRN: [id number]  HPI  57 year old patient who is seen today for follow-up.  She has essential hypertension and uncomplicated asthma.  She is seen today for her six-month follow-up.  She is doing well without concerns or complaints  Past Medical History:  Diagnosis Date  . ALLERGIC RHINITIS 01/23/2008  . ANXIETY 01/23/2008  . ASTHMA 01/23/2008  . GERD 01/23/2008  . Hot flashes, menopausal   . HYPERTENSION 09/12/2009  . PARESTHESIA 08/20/2008     Social History   Socioeconomic History  . Marital status: Married    Spouse name: Not on file  . Number of children: Not on file  . Years of education: Not on file  . Highest education level: Not on file  Occupational History  . Not on file  Social Needs  . Financial resource strain: Not on file  . Food insecurity:    Worry: Not on file    Inability: Not on file  . Transportation needs:    Medical: Not on file    Non-medical: Not on file  Tobacco Use  . Smoking status: Former Smoker    Types: Cigarettes    Last attempt to quit: 08/30/1978    Years since quitting: 39.5  . Smokeless tobacco: Never Used  Substance and Sexual Activity  . Alcohol use: Yes    Alcohol/week: 8.4 oz    Types: 14 Standard drinks or equivalent per week  . Drug use: No  . Sexual activity: Not on file  Lifestyle  . Physical activity:    Days per week: Not on file    Minutes per session: Not on file  . Stress: Not on file  Relationships  . Social connections:    Talks on phone: Not on file    Gets together: Not on file    Attends religious service: Not on file    Active member of club or organization: Not on file    Attends meetings of clubs or organizations: Not on file    Relationship status: Not on file  . Intimate partner violence:    Fear of current or ex partner: Not on file    Emotionally abused: Not on file    Physically abused: Not on file    Forced sexual  activity: Not on file  Other Topics Concern  . Not on file  Social History Narrative  . Not on file    Past Surgical History:  Procedure Laterality Date  . LASIK     Bil eyes    History reviewed. No pertinent family history.  No Known Allergies  Current Outpatient Medications on File Prior to Visit  Medication Sig Dispense Refill  . BIOTIN PO Take by mouth daily.      . Calcium Carbonate-Vitamin D (CALCIUM 600+D) 600-200 MG-UNIT TABS Take 1 tablet by mouth daily. Take 500 mg daily    . cetirizine (KLS ALLER-TEC) 10 MG tablet Take 10 mg by mouth daily.    . Fiber, Guar Gum, CHEW Chew 2 each by mouth daily.    Marland Kitchen lisinopril-hydrochlorothiazide (PRINZIDE,ZESTORETIC) 10-12.5 MG tablet TAKE 1 TABLET EACH DAY. 90 tablet 0   No current facility-administered medications on file prior to visit.     BP 90/60 (BP Location: Right Arm, Patient Position: Sitting, Cuff Size: Large)   Pulse 96   Temp 98.1 F (36.7 C) (Oral)   Wt 172 lb 12.8 oz (78.4  kg)   LMP 10/13/2011   SpO2 98%   BMI 31.61 kg/m      Review of Systems  Constitutional: Negative.   HENT: Negative for congestion, dental problem, hearing loss, rhinorrhea, sinus pressure, sore throat and tinnitus.   Eyes: Negative for pain, discharge and visual disturbance.  Respiratory: Negative for cough and shortness of breath.   Cardiovascular: Negative for chest pain, palpitations and leg swelling.  Gastrointestinal: Negative for abdominal distention, abdominal pain, blood in stool, constipation, diarrhea, nausea and vomiting.  Genitourinary: Negative for difficulty urinating, dysuria, flank pain, frequency, hematuria, pelvic pain, urgency, vaginal bleeding, vaginal discharge and vaginal pain.  Musculoskeletal: Negative for arthralgias, gait problem and joint swelling.  Skin: Negative for rash.  Neurological: Negative for dizziness, syncope, speech difficulty, weakness, numbness and headaches.  Hematological: Negative for  adenopathy.  Psychiatric/Behavioral: Negative for agitation, behavioral problems and dysphoric mood. The patient is not nervous/anxious.        Objective:   Physical Exam  Constitutional: She is oriented to person, place, and time. She appears well-developed and well-nourished.  Blood pressure 100/70  HENT:  Head: Normocephalic.  Right Ear: External ear normal.  Left Ear: External ear normal.  Mouth/Throat: Oropharynx is clear and moist.  Eyes: Pupils are equal, round, and reactive to light. Conjunctivae and EOM are normal.  Neck: Normal range of motion. Neck supple. No thyromegaly present.  Cardiovascular: Normal rate, regular rhythm, normal heart sounds and intact distal pulses.  Pulmonary/Chest: Effort normal and breath sounds normal.  Abdominal: Soft. Bowel sounds are normal. She exhibits no mass. There is no tenderness.  Musculoskeletal: Normal range of motion.  Lymphadenopathy:    She has no cervical adenopathy.  Neurological: She is alert and oriented to person, place, and time.  Skin: Skin is warm and dry. No rash noted.  Psychiatric: She has a normal mood and affect. Her behavior is normal.          Assessment & Plan:  Essential hypertension.  Blood pressure low normal today patient denies any dizziness or any orthostatic symptoms.  Will observe on present regimen Asthma stable  No change in medical regimen Medications refilled Patient will follow-up with a new provider in 6 months  Marletta Lor

## 2018-03-22 ENCOUNTER — Ambulatory Visit (INDEPENDENT_AMBULATORY_CARE_PROVIDER_SITE_OTHER): Payer: PRIVATE HEALTH INSURANCE | Admitting: Internal Medicine

## 2018-03-22 ENCOUNTER — Encounter: Payer: Self-pay | Admitting: Internal Medicine

## 2018-03-22 VITALS — BP 124/64 | HR 102 | Temp 98.0°F | Wt 174.0 lb

## 2018-03-22 DIAGNOSIS — M779 Enthesopathy, unspecified: Secondary | ICD-10-CM | POA: Diagnosis not present

## 2018-03-22 DIAGNOSIS — I10 Essential (primary) hypertension: Secondary | ICD-10-CM | POA: Diagnosis not present

## 2018-03-22 DIAGNOSIS — M7751 Other enthesopathy of right foot: Secondary | ICD-10-CM

## 2018-03-22 NOTE — Progress Notes (Signed)
Subjective:    Patient ID: Brandy Todd, female    DOB: 1961-03-16, 57 y.o.   MRN: [id number]  HPI  57 year old patient who presents with a 3-day history of right foot pain maximal in the region of the second and third toe. No obvious focal trauma but the patient has been very active with activities such as swimming at the pool which requires considerable walking on concrete surfaces  Past Medical History:  Diagnosis Date  . ALLERGIC RHINITIS 01/23/2008  . ANXIETY 01/23/2008  . ASTHMA 01/23/2008  . GERD 01/23/2008  . Hot flashes, menopausal   . HYPERTENSION 09/12/2009  . PARESTHESIA 08/20/2008     Social History   Socioeconomic History  . Marital status: Married    Spouse name: Not on file  . Number of children: Not on file  . Years of education: Not on file  . Highest education level: Not on file  Occupational History  . Not on file  Social Needs  . Financial resource strain: Not on file  . Food insecurity:    Worry: Not on file    Inability: Not on file  . Transportation needs:    Medical: Not on file    Non-medical: Not on file  Tobacco Use  . Smoking status: Former Smoker    Types: Cigarettes    Last attempt to quit: 08/30/1978    Years since quitting: 39.5  . Smokeless tobacco: Never Used  Substance and Sexual Activity  . Alcohol use: Yes    Alcohol/week: 8.4 oz    Types: 14 Standard drinks or equivalent per week  . Drug use: No  . Sexual activity: Not on file  Lifestyle  . Physical activity:    Days per week: Not on file    Minutes per session: Not on file  . Stress: Not on file  Relationships  . Social connections:    Talks on phone: Not on file    Gets together: Not on file    Attends religious service: Not on file    Active member of club or organization: Not on file    Attends meetings of clubs or organizations: Not on file    Relationship status: Not on file  . Intimate partner violence:    Fear of current or ex partner: Not on file   Emotionally abused: Not on file    Physically abused: Not on file    Forced sexual activity: Not on file  Other Topics Concern  . Not on file  Social History Narrative  . Not on file    Past Surgical History:  Procedure Laterality Date  . LASIK     Bil eyes    History reviewed. No pertinent family history.  No Known Allergies  Current Outpatient Medications on File Prior to Visit  Medication Sig Dispense Refill  . Albuterol Sulfate (PROAIR RESPICLICK) 557 (90 Base) MCG/ACT AEPB Inhale 2 puffs into the lungs 4 (four) times daily as needed. 1 each 4  . BIOTIN PO Take by mouth daily.      . Budesonide (PULMICORT FLEXHALER) 90 MCG/ACT inhaler Inhale 2 puffs into the lungs 2 (two) times daily. 1 Inhaler 3  . Calcium Carbonate-Vitamin D (CALCIUM 600+D) 600-200 MG-UNIT TABS Take 1 tablet by mouth daily. Take 500 mg daily    . cetirizine (KLS ALLER-TEC) 10 MG tablet Take 10 mg by mouth daily.    . Fiber, Guar Gum, CHEW Chew 2 each by mouth daily.    Marland Kitchen lisinopril-hydrochlorothiazide (  PRINZIDE,ZESTORETIC) 10-12.5 MG tablet TAKE 1 TABLET EACH DAY. 90 tablet 4  . omeprazole (PRILOSEC) 20 MG capsule Take 1 capsule (20 mg total) by mouth daily. 90 capsule 3   No current facility-administered medications on file prior to visit.     BP 124/64 (BP Location: Left Arm, Patient Position: Sitting, Cuff Size: Normal)   Pulse (!) 102   Temp 98 F (36.7 C) (Oral)   Wt 174 lb (78.9 kg)   LMP 10/13/2011   SpO2 97%   BMI 31.83 kg/m     Review of Systems  Constitutional: Negative.   Musculoskeletal: Positive for arthralgias.       Right foot pain       Objective:   Physical Exam  Constitutional: She appears well-developed and well-nourished. No distress.  Blood pressure 124/64  Musculoskeletal:  No soft tissue swelling or inflammatory changes; plantar flexion of the right second and third toes and especially dorsiflexion reproduced her symptoms          Assessment & Plan:  Right  foot tendinitis.  Patient will continue with naproxen Foot care discussed Patient will call if unimproved  Hypertension well controlled  Patient will follow up with a new provider in 6 months  Marletta Lor

## 2018-03-22 NOTE — Patient Instructions (Signed)
Call or return to clinic prn if these symptoms worsen or fail to improve as anticipated.

## 2018-05-29 LAB — HM DEXA SCAN

## 2018-05-29 LAB — HM MAMMOGRAPHY

## 2018-06-18 ENCOUNTER — Other Ambulatory Visit: Payer: Self-pay | Admitting: Internal Medicine

## 2018-06-26 ENCOUNTER — Encounter: Payer: Self-pay | Admitting: Family Medicine

## 2018-07-15 ENCOUNTER — Ambulatory Visit (INDEPENDENT_AMBULATORY_CARE_PROVIDER_SITE_OTHER): Payer: PRIVATE HEALTH INSURANCE | Admitting: Podiatry

## 2018-07-15 ENCOUNTER — Ambulatory Visit (INDEPENDENT_AMBULATORY_CARE_PROVIDER_SITE_OTHER): Payer: PRIVATE HEALTH INSURANCE

## 2018-07-15 ENCOUNTER — Encounter: Payer: Self-pay | Admitting: Podiatry

## 2018-07-15 VITALS — BP 97/74 | HR 105 | Temp 97.9°F | Resp 18

## 2018-07-15 DIAGNOSIS — S92912A Unspecified fracture of left toe(s), initial encounter for closed fracture: Secondary | ICD-10-CM

## 2018-07-15 DIAGNOSIS — M1000 Idiopathic gout, unspecified site: Secondary | ICD-10-CM

## 2018-07-15 DIAGNOSIS — L539 Erythematous condition, unspecified: Secondary | ICD-10-CM

## 2018-07-15 MED ORDER — METHYLPREDNISOLONE 4 MG PO TBPK: ORAL_TABLET | ORAL | 0 refills | Status: DC

## 2018-07-15 NOTE — Patient Instructions (Signed)
Start the steroids I sent to your pharmacy. If you notice any increase in redness, drainage, red streaks or any other signs of infection, please call us.   If was nice to meet you today. If you have any questions or any further concerns, please feel fee to give me a call. You can call our office at [phone] or please feel fee to send me a message through [place].

## 2018-07-15 NOTE — Progress Notes (Signed)
Subjective:    Patient ID: Brandy Todd, female    DOB: 1961-08-13, 57 y.o.   MRN: [id number]  HPI  57 year old female presents the office today for concerns of left second toe swelling and redness.  She states that she has had this before on her other foot as well as this foot.  She denies any recent injury or trauma denies any open sores.  The area is painful with pressure.  She states it has gotten somewhat better.  She said all her toes were swollen and this went away but the left second toe remains swollen and red.  She was treated for tendinitis previously when she had this.  She does not have a history of gout that she can recall.  She does drink alcohol she also eats meat and seafood.  Review of Systems  All other systems reviewed and are negative.  Past Medical History:  Diagnosis Date  . ALLERGIC RHINITIS 01/23/2008  . ANXIETY 01/23/2008  . ASTHMA 01/23/2008  . GERD 01/23/2008  . Hot flashes, menopausal   . HYPERTENSION 09/12/2009  . PARESTHESIA 08/20/2008    Past Surgical History:  Procedure Laterality Date  . LASIK     Bil eyes     Current Outpatient Medications:  .  Albuterol Sulfate (PROAIR RESPICLICK) 381 (90 Base) MCG/ACT AEPB, Inhale 2 puffs into the lungs 4 (four) times daily as needed., Disp: 1 each, Rfl: 4 .  BIOTIN PO, Take by mouth daily.  , Disp: , Rfl:  .  Calcium Carbonate-Vitamin D (CALCIUM 600+D) 600-200 MG-UNIT TABS, Take 1 tablet by mouth daily. Take 500 mg daily, Disp: , Rfl:  .  cetirizine (KLS ALLER-TEC) 10 MG tablet, Take 10 mg by mouth daily., Disp: , Rfl:  .  Fiber, Guar Gum, CHEW, Chew 2 each by mouth daily., Disp: , Rfl:  .  lisinopril-hydrochlorothiazide (PRINZIDE,ZESTORETIC) 10-12.5 MG tablet, TAKE 1 TABLET EACH DAY., Disp: 90 tablet, Rfl: 4 .  methylPREDNISolone (MEDROL DOSEPAK) 4 MG TBPK tablet, Take as directed, Disp: 21 tablet, Rfl: 0 .  omeprazole (PRILOSEC) 20 MG capsule, Take 1 capsule (20 mg total) by mouth daily., Disp: 90 capsule,  Rfl: 3 .  PULMICORT FLEXHALER 90 MCG/ACT inhaler, INHALE 2 PUFFS TWICE DAILY, Disp: 1 Inhaler, Rfl: 2  No Known Allergies       Objective:   Physical Exam  General: AAO x3, NAD  Dermatological: There is localized edema and erythema to the left second toe and the DIPJ.  There is no pain or drainage from the toenail site and this is more from the joint itself.  There is no fluctuation or crepitation.  No ascending sialitis.  Swelling present at the second toe.  No other areas of erythema identified.  Vascular: Dorsalis Pedis artery and Posterior Tibial artery pedal pulses are 2/4 bilateral with immedate capillary fill time.  There is no pain with calf compression, swelling, warmth, erythema.   Neruologic: Grossly intact via light touch bilateral.Protective threshold with Semmes Wienstein monofilament intact to all pedal sites bilateral.   Musculoskeletal: Tenderness of the left second toe.  No other areas of tenderness elicited today.  Muscular strength 5/5 in all groups tested bilateral.  Gait: Unassisted, Nonantalgic.     Assessment & Plan:  57 year old female left second toe erythema, likely gout versus infection -Treatment options discussed including all alternatives, risks, and complications -Etiology of symptoms were discussed -X-rays were obtained and reviewed with the patient.  No definitive evidence of acute fracture or stress  fracture identified today. -Given her history she is had this before I am leaning more towards gout.  I prescribed a Medrol Dosepak.  We will check a uric acid level, ESR, CRP, CBC.  Monitor for any signs or symptoms of infection that we discussed with me know immediately should any occur and will start antibiotics.  Trula Slade DPM

## 2018-07-18 LAB — CBC WITH DIFFERENTIAL/PLATELET
BASOS PCT: 0.1 %
Basophils Absolute: 8 cells/uL (ref 0–200)
EOS PCT: 0 %
Eosinophils Absolute: 0 cells/uL — ABNORMAL LOW (ref 15–500)
HCT: 35.4 % (ref 35.0–45.0)
HEMOGLOBIN: 12.6 g/dL (ref 11.7–15.5)
LYMPHS ABS: 1070 {cells}/uL (ref 850–3900)
MCH: 32.9 pg (ref 27.0–33.0)
MCHC: 35.6 g/dL (ref 32.0–36.0)
MCV: 92.4 fL (ref 80.0–100.0)
MONOS PCT: 6.2 %
MPV: 9.3 fL (ref 7.5–12.5)
NEUTROS ABS: 6145 {cells}/uL (ref 1500–7800)
Neutrophils Relative %: 79.8 %
Platelets: 356 10*3/uL (ref 140–400)
RBC: 3.83 10*6/uL (ref 3.80–5.10)
RDW: 12.6 % (ref 11.0–15.0)
Total Lymphocyte: 13.9 %
WBC mixed population: 477 cells/uL (ref 200–950)
WBC: 7.7 10*3/uL (ref 3.8–10.8)

## 2018-07-18 LAB — SEDIMENTATION RATE: SED RATE: 33 mm/h — AB (ref 0–30)

## 2018-07-18 LAB — C-REACTIVE PROTEIN: CRP: 4.3 mg/L (ref <8.0)

## 2018-07-18 LAB — URIC ACID: URIC ACID, SERUM: 9.1 mg/dL — AB (ref 2.5–7.0)

## 2018-07-25 ENCOUNTER — Telehealth: Payer: Self-pay | Admitting: *Deleted

## 2018-07-25 NOTE — Telephone Encounter (Signed)
I called pt she states she has completed the steroids and the foot is better, still a little red and some discomfort but she is better. I offered the colchicine and pt stated she will wait.

## 2018-07-25 NOTE — Telephone Encounter (Signed)
-----   Message from Trula Slade, DPM sent at 07/20/2018  7:11 AM EST ----- Val- please let her know that her uric acid level is high and she has gout. Can you see how she did on the steroids? If she is still having problems we can do colchicine 0.6mg  daily for a week.

## 2018-07-25 NOTE — Telephone Encounter (Signed)
Left message informing pt Dr. Jacqualyn Posey had reviewed her lab results and to call with instructions.

## 2018-07-25 NOTE — Telephone Encounter (Deleted)
-----   Message from Trula Slade, DPM sent at 07/20/2018  7:11 AM EST ----- Val- please let her know that her uric acid level is high and she has gout. Can you see how she did on the steroids? If she is still having problems we can do colchicine 0.6mg  daily for a week.

## 2018-07-26 MED ORDER — COLCHICINE 0.6 MG PO TABS: 0.6000 mg | ORAL_TABLET | Freq: Every day | ORAL | 0 refills | Status: DC

## 2018-07-26 NOTE — Telephone Encounter (Signed)
Pt presents to office states she would like the prescription of the colchicine sent to her pharmacy and she did not know if I had gotten her message this morning. I had reviewed my voicemail and was unable to get pt's name clearly or phone number. I told R. Komonski - front reception to inform pt I would send the colchicine right now.

## 2018-07-26 NOTE — Addendum Note (Signed)
Addended by: Harriett Sine D on: 07/26/2018 02:01 PM   Modules accepted: Orders

## 2018-08-01 ENCOUNTER — Ambulatory Visit (INDEPENDENT_AMBULATORY_CARE_PROVIDER_SITE_OTHER): Payer: PRIVATE HEALTH INSURANCE | Admitting: Podiatry

## 2018-08-01 DIAGNOSIS — M10071 Idiopathic gout, right ankle and foot: Secondary | ICD-10-CM

## 2018-08-01 DIAGNOSIS — M1000 Idiopathic gout, unspecified site: Secondary | ICD-10-CM

## 2018-08-01 NOTE — Patient Instructions (Signed)

## 2018-08-07 DIAGNOSIS — M109 Gout, unspecified: Secondary | ICD-10-CM | POA: Insufficient documentation

## 2018-08-07 DIAGNOSIS — M1000 Idiopathic gout, unspecified site: Secondary | ICD-10-CM | POA: Insufficient documentation

## 2018-08-07 NOTE — Progress Notes (Signed)
Subjective: 57 year old female presents the office today for follow-up evaluation of left second toe erythema.  She is on the colchicine.  She said that she is doing well is having no pain in the redness is significantly improved.  Denies any recent injury or trauma or any other changes since I last saw her. Denies any systemic complaints such as fevers, chills, nausea, vomiting. No acute changes since last appointment, and no other complaints at this time.   Objective: AAO x3, NAD DP/PT pulses palpable bilaterally, CRT less than 3 seconds There is been walking toe has almost resolved this point.  There is no open sores there is no erythema.  There is minimal edema present.  No open lesions. No open lesions or pre-ulcerative lesions.  No pain with calf compression, swelling, warmth, erythema  Assessment: Resolving gout left foot  Plan: -All treatment options discussed with the patient including all alternatives, risks, complications.  -We discussed the etiology and natural progression of gout.  We discussed foods to avoid as well.  We will monitor for any recurrence.  If she continues to get multiple attacks then consider long-term allopurinol.  I do want her to go have blood work done in the next few weeks when she is off the colchicine.  She is 1 more colchicine that she is going to take today. -Patient encouraged to call the office with any questions, concerns, change in symptoms.   Trula Slade DPM

## 2018-08-11 ENCOUNTER — Telehealth: Payer: Self-pay | Admitting: *Deleted

## 2018-08-11 LAB — URIC ACID: Uric Acid, Serum: 11.1 mg/dL — ABNORMAL HIGH (ref 2.5–7.0)

## 2018-08-11 LAB — BASIC METABOLIC PANEL
BUN / CREAT RATIO: 21 (calc) (ref 6–22)
BUN: 29 mg/dL — ABNORMAL HIGH (ref 7–25)
CALCIUM: 9.5 mg/dL (ref 8.6–10.4)
CHLORIDE: 99 mmol/L (ref 98–110)
CO2: 24 mmol/L (ref 20–32)
Creat: 1.41 mg/dL — ABNORMAL HIGH (ref 0.50–1.05)
GLUCOSE: 94 mg/dL (ref 65–99)
Potassium: 4.7 mmol/L (ref 3.5–5.3)
SODIUM: 134 mmol/L — AB (ref 135–146)

## 2018-08-11 NOTE — Telephone Encounter (Signed)
-----   Message from Trula Slade, DPM sent at 08/11/2018  7:31 AM EST ----- Val-please let her know that her uric acid level is even higher than what it was and her kidney function is also somewhat decreased. Please send the labs to her PCP and she needs to get in to see them.

## 2018-08-11 NOTE — Telephone Encounter (Signed)
Left message on mobile and home phone to call to discuss results.

## 2018-08-14 NOTE — Telephone Encounter (Signed)
I called [place] at [facility] for fax, [place] states fax [phone]. Faxed the 08/10/2018 and 07/17/2018 labs to [place] at [place] attn:  Dr. Micheline Rough.

## 2018-08-14 NOTE — Telephone Encounter (Signed)
Left message on pt's home phone to call for lab results. I spoke with pt and she states Dr. Burnice Logan retired and she will be seeing another doctor in that [employer] on [address]. Pt states she will be seeing Dr. Micheline Rough.

## 2018-08-17 IMAGING — US US ABDOMEN LIMITED
1 series · 14 of 25 positions shown · non-contrast
Comparison: None.

CLINICAL DATA: Right upper quadrant pain since 3366 hours
yesterday.

EXAM:
US ABDOMEN LIMITED - RIGHT UPPER QUADRANT

[Series 1: us abdomen limited · 0.15mm/px · 14 of 66 slices shown]
[im 1/66]
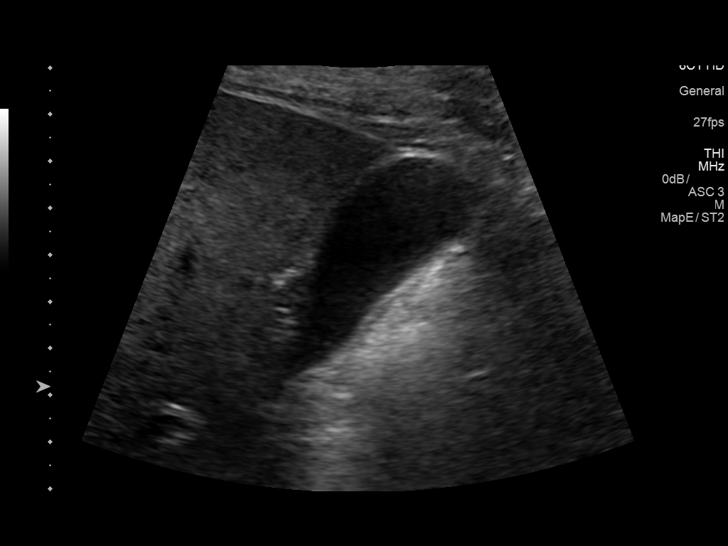
[im 6/66]
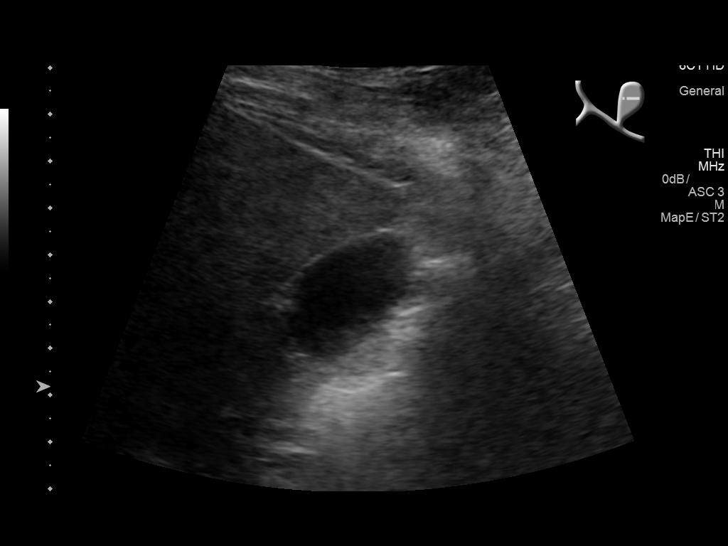
[im 11/66]
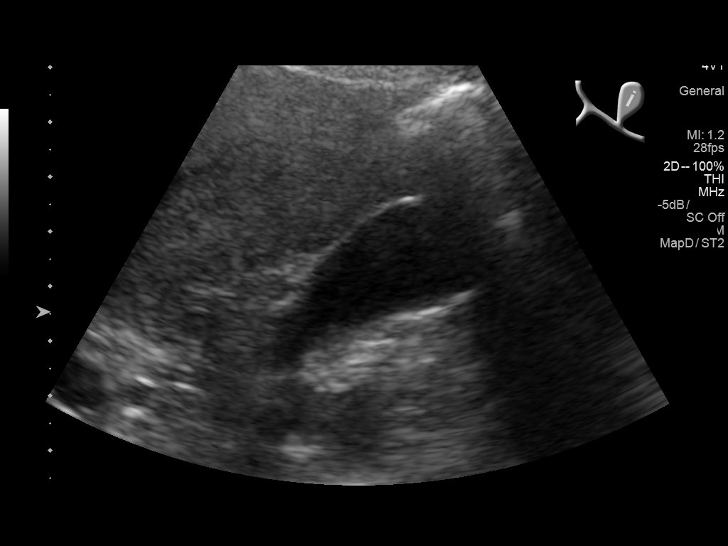
[im 17/66]
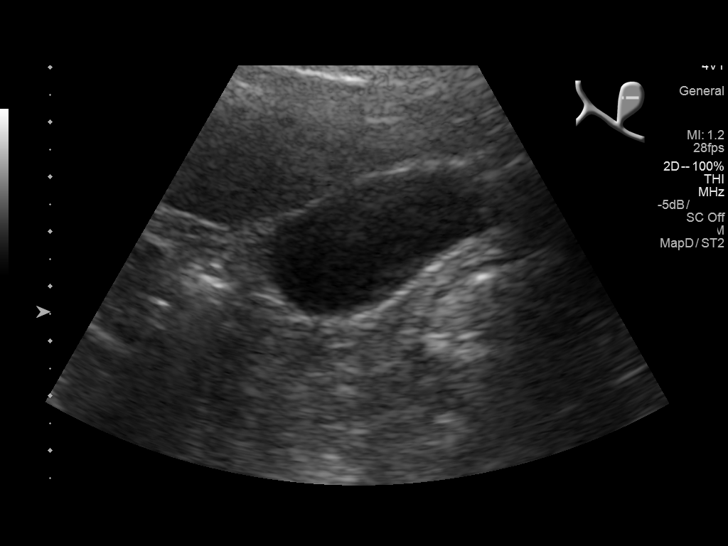
[im 22/66]
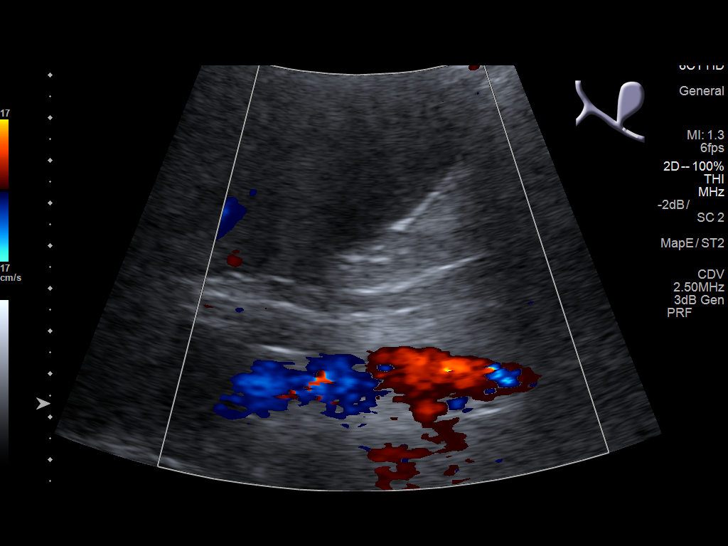
[im 25/66]
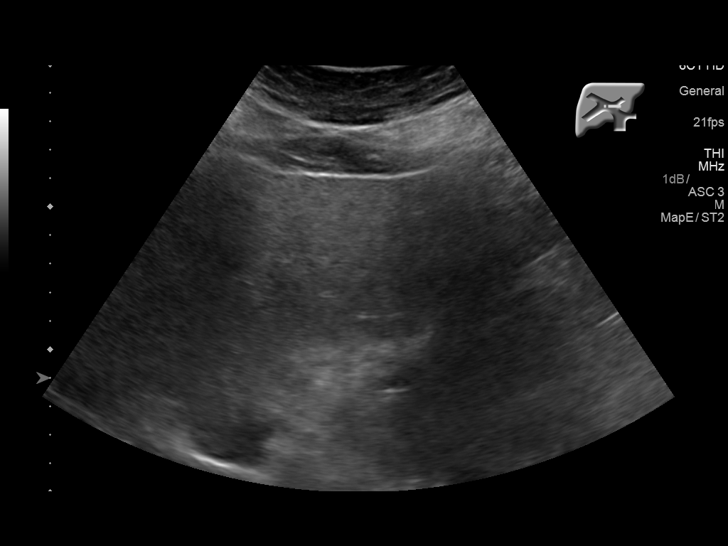
[im 30/66]
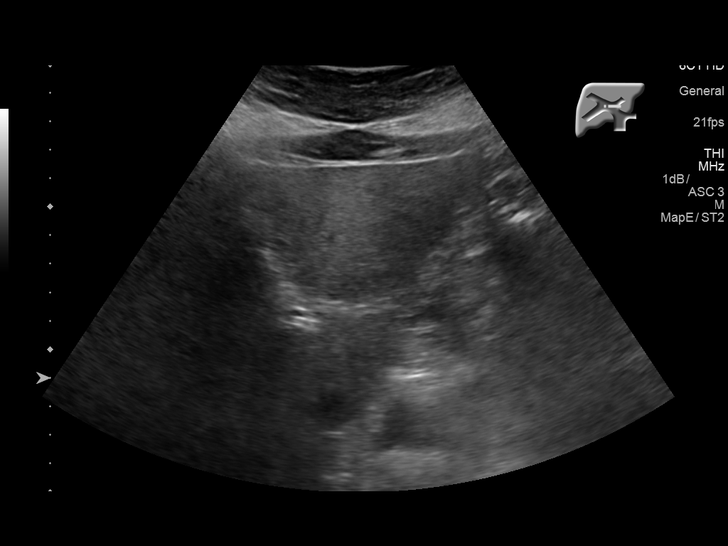
[im 36/66]
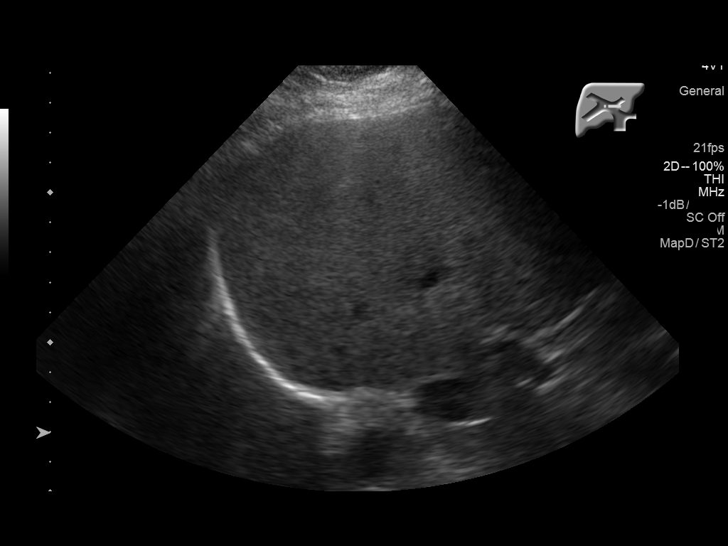
[im 41/66]
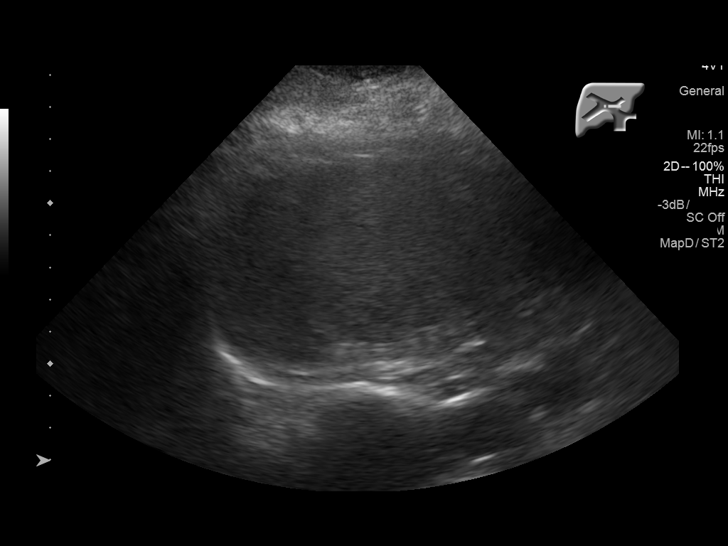
[im 44/66]
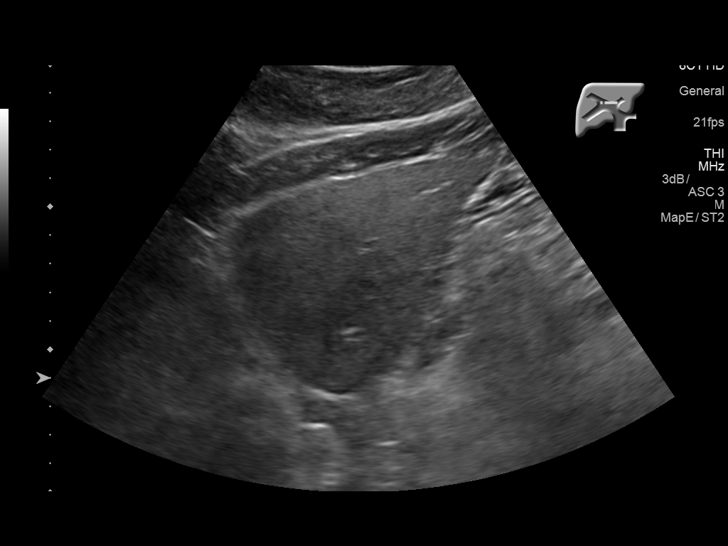
[im 49/66]
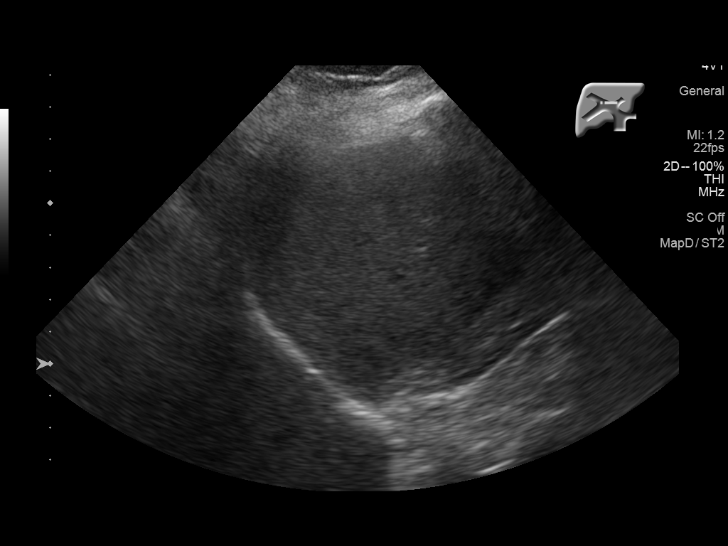
[im 55/66]
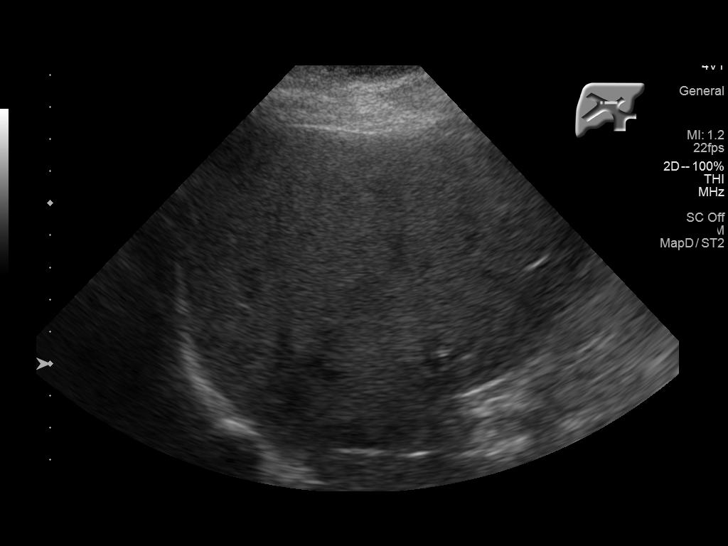
[im 60/66]
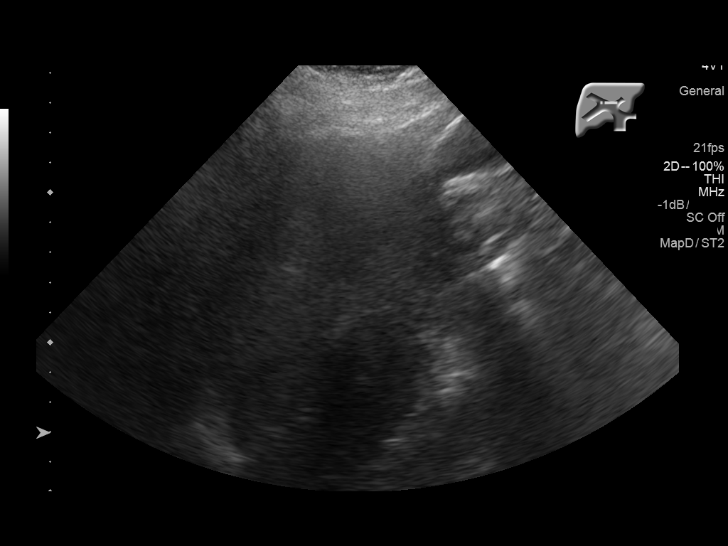
[im 66/66]
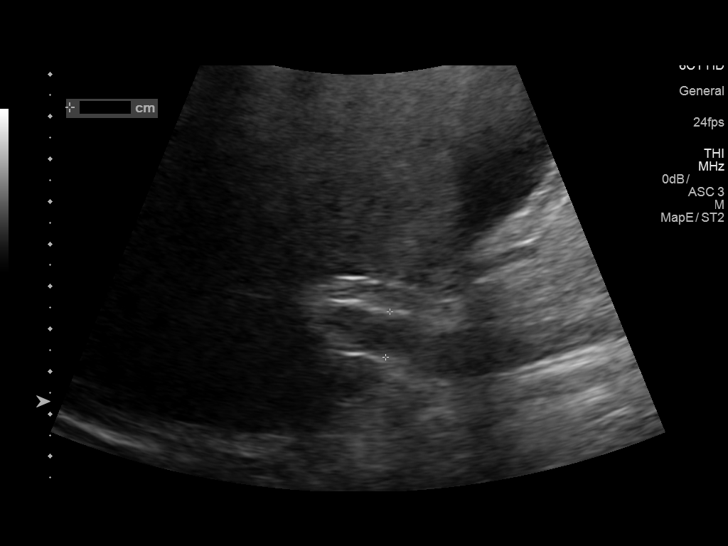

[14 of 25 positions shown; findings below may reference images not displayed]

FINDINGS: Gallbladder:

No gallstones or wall thickening visualized. No sonographic Murphy
sign noted by sonographer.

Common bile duct:

Diameter: 5.6 mm, normal

Liver:

Diffusely increased hepatic parenchymal echotexture suggesting
diffuse fatty infiltration. No focal liver lesions identified.
IMPRESSION: Diffuse fatty infiltration of the liver. No evidence of
cholelithiasis or cholecystitis.

## 2018-10-20 ENCOUNTER — Encounter: Payer: Self-pay | Admitting: Family Medicine

## 2018-10-20 ENCOUNTER — Ambulatory Visit (INDEPENDENT_AMBULATORY_CARE_PROVIDER_SITE_OTHER): Payer: PRIVATE HEALTH INSURANCE | Admitting: Family Medicine

## 2018-10-20 VITALS — BP 130/68 | HR 85 | Temp 98.0°F | Wt 172.3 lb

## 2018-10-20 DIAGNOSIS — Z13220 Encounter for screening for lipoid disorders: Secondary | ICD-10-CM

## 2018-10-20 DIAGNOSIS — K219 Gastro-esophageal reflux disease without esophagitis: Secondary | ICD-10-CM | POA: Diagnosis not present

## 2018-10-20 DIAGNOSIS — J4520 Mild intermittent asthma, uncomplicated: Secondary | ICD-10-CM

## 2018-10-20 DIAGNOSIS — I10 Essential (primary) hypertension: Secondary | ICD-10-CM | POA: Diagnosis not present

## 2018-10-20 DIAGNOSIS — T7840XD Allergy, unspecified, subsequent encounter: Secondary | ICD-10-CM | POA: Diagnosis not present

## 2018-10-20 DIAGNOSIS — M1000 Idiopathic gout, unspecified site: Secondary | ICD-10-CM

## 2018-10-20 DIAGNOSIS — R7989 Other specified abnormal findings of blood chemistry: Secondary | ICD-10-CM

## 2018-10-20 MED ORDER — LISINOPRIL 10 MG PO TABS: 10.0000 mg | ORAL_TABLET | Freq: Every day | ORAL | 1 refills | Status: DC

## 2018-10-20 NOTE — Patient Instructions (Signed)
Please check blood pressures at home and let me know how readings have looked when we call with labwork results.   Stop lisinopril-hctz. Start lisinopril 10mg  daily.

## 2018-10-20 NOTE — Progress Notes (Signed)
Brandy Todd DOB: 1960-10-04 Encounter date: 10/20/2018  This is a 58 y.o. female who presents to establish care. Chief Complaint  Patient presents with  . Transitions Of Care    History of present illness: No specific concerns today.   DJM:[id number]. Doesn't typically check bp at home. Does well with current medication. Occasionally about once a month will get a little low - light headed.   Allergic rhinitis:cetirizine. Son has new dog that sheds a lot so feels she will have increased sx.   Asthma: has albuterol. Has been great with asthma. Occasionally notes issue with change in the seasons. When dx was told it was allergy related. Does pulmicort regularly. Uses albuterol once a year or every other year.   GERD:on omeprazole; unable to stop this medication.   Anxiety: does not have problems with this typically. Not sure when listed in chart.   Gout:colchicine for acute flares, has been following with Dr. Jacqualyn Posey, podiatry. Last bloodwork in Dec showed elevated renal function and uric acid. Only had it one time; never really went away initially. Not painful. Still a little swollen. Thinks triggered by alcohol intake. Has quit drinking during week since episode.  At the time of the episode she was drinking more heavily around the holidays.  Denies that alcohol has been a problem for her in the past.  Sees Dr. Matthew Saras. Getting regular mammogram, pap smears. Started on vitamin D for bone density issues noted on DEXA through them.   Past Medical History:  Diagnosis Date  . ALLERGIC RHINITIS 01/23/2008  . ANXIETY 01/23/2008  . Anxiety state 01/23/2008   Qualifier: Diagnosis of  By: Burnice Logan  MD, Doretha Sou   . ASTHMA 01/23/2008  . GERD 01/23/2008  . Hot flashes, menopausal   . HYPERTENSION 09/12/2009  . PARESTHESIA 08/20/2008  . Squamous cell cancer of skin of upper arm 2019   right arm   Past Surgical History:  Procedure Laterality Date  . LASIK     Bil eyes  . SQUAMOUS CELL  CARCINOMA EXCISION     No Known Allergies Current Meds  Medication Sig  . Albuterol Sulfate (PROAIR RESPICLICK) 989 (90 Base) MCG/ACT AEPB Inhale 2 puffs into the lungs 4 (four) times daily as needed.  Marland Kitchen BIOTIN PO Take by mouth daily.    . Calcium Carbonate-Vitamin D (CALCIUM 600+D) 600-200 MG-UNIT TABS Take 1 tablet by mouth daily. Take 500 mg daily  . cetirizine (KLS ALLER-TEC) 10 MG tablet Take 10 mg by mouth daily.  . Fiber, Guar Gum, CHEW Chew 2 each by mouth daily.  Marland Kitchen omeprazole (PRILOSEC) 20 MG capsule Take 1 capsule (20 mg total) by mouth daily.  Marland Kitchen PULMICORT FLEXHALER 90 MCG/ACT inhaler INHALE 2 PUFFS TWICE DAILY  . [DISCONTINUED] lisinopril-hydrochlorothiazide (PRINZIDE,ZESTORETIC) 10-12.5 MG tablet TAKE 1 TABLET EACH DAY.  . [DISCONTINUED] methylPREDNISolone (MEDROL DOSEPAK) 4 MG TBPK tablet Take as directed   Social History   Tobacco Use  . Smoking status: Former Smoker    Types: Cigarettes    Last attempt to quit: 08/30/1978    Years since quitting: 40.1  . Smokeless tobacco: Never Used  Substance Use Topics  . Alcohol use: Yes    Alcohol/week: 14.0 standard drinks    Types: 14 Standard drinks or equivalent per week   Family History  Problem Relation Age of Onset  . Cancer Mother        skin - precancerous  . High blood pressure Father   . Other Father  heart valve replacement; rheumatic heart  . CAD Father   . Autoimmune disease Brother        autoimmune necrotizine neuropathy; followed at [facility]  . Cancer Maternal Grandmother        tongue  . Cancer Maternal Grandfather   . Heart failure Paternal Grandmother      Review of Systems  Constitutional: Negative for chills, fatigue and fever.  Respiratory: Negative for cough, chest tightness, shortness of breath and wheezing.   Cardiovascular: Negative for chest pain, palpitations and leg swelling.  Musculoskeletal:       Toe pain has resolved  Psychiatric/Behavioral: The patient is not nervous/anxious.      Objective:  BP 130/68 (BP Location: Left Arm, Patient Position: Sitting, Cuff Size: Normal)   Pulse 85   Temp 98 F (36.7 C) (Oral)   Wt 172 lb 4.8 oz (78.2 kg)   LMP 10/13/2011   SpO2 96%   BMI 31.51 kg/m   Weight: 172 lb 4.8 oz (78.2 kg)   BP Readings from Last 3 Encounters:  10/20/18 130/68  07/15/18 97/74  03/22/18 124/64   Wt Readings from Last 3 Encounters:  10/20/18 172 lb 4.8 oz (78.2 kg)  03/22/18 174 lb (78.9 kg)  03/15/18 172 lb 12.8 oz (78.4 kg)    Physical Exam Constitutional:      General: She is not in acute distress.    Appearance: She is well-developed.  Cardiovascular:     Rate and Rhythm: Normal rate and regular rhythm.     Heart sounds: Normal heart sounds. No murmur. No friction rub.  Pulmonary:     Effort: Pulmonary effort is normal. No respiratory distress.     Breath sounds: Normal breath sounds. No wheezing or rales.  Musculoskeletal:     Right lower leg: No edema.     Left lower leg: No edema.     Comments: Second toe left foot is slightly larger than other toes. No significant color change and no tenderness to palpation.   Neurological:     Mental Status: She is alert and oriented to person, place, and time.  Psychiatric:        Behavior: Behavior normal.     Assessment/Plan: 1. Essential hypertension Stable. We are stopping hctz since this could contribute to strain on kidneys and help with uric acid levels.  - Comprehensive metabolic panel; Future - CBC with Differential/Platelet; Future - lisinopril (PRINIVIL,ZESTRIL) 10 MG tablet; Take 1 tablet (10 mg total) by mouth daily.  Dispense: 90 tablet; Refill: 1  2. Allergic state, subsequent encounter Continue zyrtec.   3. Mild intermittent asthma without complication Stable; continue pulmicort  4. Gastroesophageal reflux disease without esophagitis Stable with omeprazole.  Continue this medication.  5. Idiopathic gout, unspecified chronicity, unspecified site Last gout  episode was her first, although it was prolonged.  She does feel like this was triggered by her alcohol intake.  We will recheck uric acid levels and determine if any further treatment is necessary at this point. - Uric acid; Future  6. Elevated serum creatinine Discussed rechecking blood work when she is well-hydrated.  8. Lipid screening - Lipid panel; Future  Return pending bloodwork results.  Micheline Rough, MD

## 2018-10-21 ENCOUNTER — Encounter: Payer: Self-pay | Admitting: Family Medicine

## 2018-10-24 ENCOUNTER — Encounter: Payer: Self-pay | Admitting: Family Medicine

## 2018-10-27 ENCOUNTER — Encounter: Payer: Self-pay | Admitting: Family Medicine

## 2018-11-03 ENCOUNTER — Other Ambulatory Visit (INDEPENDENT_AMBULATORY_CARE_PROVIDER_SITE_OTHER): Payer: PRIVATE HEALTH INSURANCE

## 2018-11-03 DIAGNOSIS — I10 Essential (primary) hypertension: Secondary | ICD-10-CM | POA: Diagnosis not present

## 2018-11-03 DIAGNOSIS — M1000 Idiopathic gout, unspecified site: Secondary | ICD-10-CM

## 2018-11-03 DIAGNOSIS — Z13220 Encounter for screening for lipoid disorders: Secondary | ICD-10-CM

## 2018-11-03 LAB — CBC WITH DIFFERENTIAL/PLATELET
Basophils Absolute: 0.1 10*3/uL (ref 0.0–0.1)
Basophils Relative: 1.2 % (ref 0.0–3.0)
Eosinophils Absolute: 0.1 10*3/uL (ref 0.0–0.7)
Eosinophils Relative: 2.4 % (ref 0.0–5.0)
HCT: 38.1 % (ref 36.0–46.0)
Hemoglobin: 12.6 g/dL (ref 12.0–15.0)
Lymphocytes Relative: 33.6 % (ref 12.0–46.0)
Lymphs Abs: 1.5 10*3/uL (ref 0.7–4.0)
MCHC: 33 g/dL (ref 30.0–36.0)
MCV: 94.9 fl (ref 78.0–100.0)
Monocytes Absolute: 0.6 10*3/uL (ref 0.1–1.0)
Monocytes Relative: 12.9 % — ABNORMAL HIGH (ref 3.0–12.0)
Neutro Abs: 2.3 10*3/uL (ref 1.4–7.7)
Neutrophils Relative %: 49.9 % (ref 43.0–77.0)
Platelets: 280 10*3/uL (ref 150.0–400.0)
RBC: 4.01 Mil/uL (ref 3.87–5.11)
RDW: 13.5 % (ref 11.5–15.5)
WBC: 4.6 10*3/uL (ref 4.0–10.5)

## 2018-11-03 LAB — COMPREHENSIVE METABOLIC PANEL
ALBUMIN: 4.2 g/dL (ref 3.5–5.2)
ALT: 15 U/L (ref 0–35)
AST: 16 U/L (ref 0–37)
Alkaline Phosphatase: 59 U/L (ref 39–117)
BUN: 17 mg/dL (ref 6–23)
CO2: 25 mEq/L (ref 19–32)
Calcium: 9.2 mg/dL (ref 8.4–10.5)
Chloride: 103 mEq/L (ref 96–112)
Creatinine, Ser: 0.98 mg/dL (ref 0.40–1.20)
GFR: 58.31 mL/min — ABNORMAL LOW (ref >60.00)
Glucose, Bld: 113 mg/dL — ABNORMAL HIGH (ref 70–99)
Potassium: 3.7 mEq/L (ref 3.5–5.1)
Sodium: 137 mEq/L (ref 135–145)
Total Bilirubin: 0.6 mg/dL (ref 0.2–1.2)
Total Protein: 6.8 g/dL (ref 6.0–8.3)

## 2018-11-03 LAB — LIPID PANEL
Cholesterol: 182 mg/dL (ref 0–200)
HDL: 56.9 mg/dL (ref >39.00)
NonHDL: 125.32
TRIGLYCERIDES: 268 mg/dL — AB (ref 0.0–149.0)
Total CHOL/HDL Ratio: 3
VLDL: 53.6 mg/dL — ABNORMAL HIGH (ref 0.0–40.0)

## 2018-11-03 LAB — URIC ACID: Uric Acid, Serum: 7.8 mg/dL — ABNORMAL HIGH (ref 2.4–7.0)

## 2018-11-03 LAB — LDL CHOLESTEROL, DIRECT: Direct LDL: 96 mg/dL

## 2018-11-03 NOTE — Progress Notes (Signed)
Noted  

## 2018-11-10 ENCOUNTER — Telehealth: Payer: Self-pay

## 2018-11-10 NOTE — Telephone Encounter (Signed)
Have you seen this form?

## 2018-11-10 NOTE — Telephone Encounter (Signed)
Copied from [place]. Topic: General - Other >> Nov 09, 2018 10:31 AM Brandy Todd wrote: Reason for CRM: Patient would like a call back RE labs and needs preventative care form for work filled out by Dr. Rosalin Hawking. She faxed the form this morning and says she forgot to bring it to her appt she had with Dr. Raliegh Ip on 10/20/2017.

## 2018-12-01 ENCOUNTER — Encounter: Payer: PRIVATE HEALTH INSURANCE | Admitting: Family Medicine

## 2019-02-08 ENCOUNTER — Telehealth: Payer: Self-pay | Admitting: Family Medicine

## 2019-02-08 NOTE — Telephone Encounter (Signed)
Pt called stating that she had been working from home since 03/16, and she is scheduled to go back to the office on June 22 nd.  She stated that where she works the cubicles are not spaced 6 feet and she is afraid with her hx of asthma she may get sick.  She is asking for a note saying that she needs to  work from home because of her chronic medical conditions.   If her pcp can reply to her thru MyChart regarding her request. If any questions, please give her a call back on her cell.

## 2019-02-09 ENCOUNTER — Encounter: Payer: Self-pay | Admitting: Family Medicine

## 2019-02-09 NOTE — Telephone Encounter (Signed)
Patient called back and was informed of the message below.  She is aware the letter will be mailed to her home address.

## 2019-02-09 NOTE — Telephone Encounter (Signed)
I left a message for the pt to return my call. 

## 2019-02-09 NOTE — Telephone Encounter (Signed)
I have printed work note for her. You can sent this response through mychart as she desires. I wrote note for OK to work from home for next 3 months with reconsideration at that time. Please let her know we can amend this if needed.

## 2019-02-14 ENCOUNTER — Encounter: Payer: Self-pay | Admitting: *Deleted

## 2019-02-14 ENCOUNTER — Telehealth: Payer: Self-pay | Admitting: Family Medicine

## 2019-02-14 NOTE — Telephone Encounter (Signed)
Patient had requested letter for her employer. 02/09/19 encounter is a letter written by Dr.Koberlein. Mailed this letter to patient and placed it in MyChart for the patient to access.

## 2019-03-07 ENCOUNTER — Ambulatory Visit (INDEPENDENT_AMBULATORY_CARE_PROVIDER_SITE_OTHER): Payer: Commercial Managed Care - PPO | Admitting: Family Medicine

## 2019-03-07 ENCOUNTER — Encounter: Payer: Self-pay | Admitting: Family Medicine

## 2019-03-07 ENCOUNTER — Other Ambulatory Visit: Payer: Self-pay

## 2019-03-07 VITALS — BP 90/60 | HR 78 | Temp 98.5°F | Ht 62.5 in | Wt 161.9 lb

## 2019-03-07 DIAGNOSIS — T7840XD Allergy, unspecified, subsequent encounter: Secondary | ICD-10-CM | POA: Diagnosis not present

## 2019-03-07 DIAGNOSIS — Z1159 Encounter for screening for other viral diseases: Secondary | ICD-10-CM

## 2019-03-07 DIAGNOSIS — E790 Hyperuricemia without signs of inflammatory arthritis and tophaceous disease: Secondary | ICD-10-CM

## 2019-03-07 DIAGNOSIS — Z0000 Encounter for general adult medical examination without abnormal findings: Secondary | ICD-10-CM | POA: Diagnosis not present

## 2019-03-07 DIAGNOSIS — J4520 Mild intermittent asthma, uncomplicated: Secondary | ICD-10-CM

## 2019-03-07 DIAGNOSIS — I10 Essential (primary) hypertension: Secondary | ICD-10-CM

## 2019-03-07 DIAGNOSIS — R739 Hyperglycemia, unspecified: Secondary | ICD-10-CM | POA: Diagnosis not present

## 2019-03-07 DIAGNOSIS — K219 Gastro-esophageal reflux disease without esophagitis: Secondary | ICD-10-CM

## 2019-03-07 LAB — BASIC METABOLIC PANEL
BUN: 25 mg/dL — ABNORMAL HIGH (ref 6–23)
CO2: 21 mEq/L (ref 19–32)
Calcium: 9.4 mg/dL (ref 8.4–10.5)
Chloride: 101 mEq/L (ref 96–112)
Creatinine, Ser: 1.23 mg/dL — ABNORMAL HIGH (ref 0.40–1.20)
GFR: 44.81 mL/min — ABNORMAL LOW (ref >60.00)
Glucose, Bld: 97 mg/dL (ref 70–99)
Potassium: 4.8 mEq/L (ref 3.5–5.1)
Sodium: 136 mEq/L (ref 135–145)

## 2019-03-07 LAB — HEMOGLOBIN A1C: Hgb A1c MFr Bld: 5.6 % (ref 4.6–6.5)

## 2019-03-07 LAB — URIC ACID: Uric Acid, Serum: 10.5 mg/dL — ABNORMAL HIGH (ref 2.4–7.0)

## 2019-03-07 MED ORDER — OMEPRAZOLE 20 MG PO CPDR: 20.0000 mg | DELAYED_RELEASE_CAPSULE | Freq: Every day | ORAL | 3 refills | Status: DC

## 2019-03-07 MED ORDER — LISINOPRIL 5 MG PO TABS: 5.0000 mg | ORAL_TABLET | Freq: Every day | ORAL | 1 refills | Status: DC

## 2019-03-07 MED ORDER — PROAIR RESPICLICK 108 (90 BASE) MCG/ACT IN AEPB: 2.0000 | INHALATION_SPRAY | Freq: Four times a day (QID) | RESPIRATORY_TRACT | 5 refills | Status: DC | PRN

## 2019-03-07 MED ORDER — PULMICORT FLEXHALER 90 MCG/ACT IN AEPB: 2.0000 | INHALATION_SPRAY | Freq: Two times a day (BID) | RESPIRATORY_TRACT | 11 refills | Status: DC

## 2019-03-07 NOTE — Progress Notes (Signed)
Brandy Todd DOB: 08-01-1961 Encounter date: 03/07/2019  This is a 57 y.o. female who presents for complete physical   Last visit was 10/20/18 for transition of care.   History of present illness/Additional concerns: HTN: on lisinopril (hctz stopped at last visit to hopefully help with future gout flares). Not checking at home.  Asthma - good control; pulmicort regularly with albuterol prn. Breathing has been ok since making it through allergy season. Had to use rescue inhaler a couple of times at night, but has done better sine then. Cetirizine for allergies. GERD: omemprazole helps with sx control. Gout hx: single flare; elevated uric acid levels - working on limiting dietary intake to help with levels. 2 weeks ago had joint issue - took aleve - had more seafood that day for husbands birthday.   Follows with Dr. Matthew Saras for routine gyn care. Has visit in next couple of months.   Past Medical History:  Diagnosis Date  . ALLERGIC RHINITIS 01/23/2008  . ANXIETY 01/23/2008  . Anxiety state 01/23/2008   Qualifier: Diagnosis of  By: Burnice Logan  MD, Doretha Sou   . ASTHMA 01/23/2008  . GERD 01/23/2008  . Hot flashes, menopausal   . HYPERTENSION 09/12/2009  . PARESTHESIA 08/20/2008  . Squamous cell cancer of skin of upper arm 2019   right arm   Past Surgical History:  Procedure Laterality Date  . LASIK     Bil eyes  . SQUAMOUS CELL CARCINOMA EXCISION     No Known Allergies Current Meds  Medication Sig  . Albuterol Sulfate (PROAIR RESPICLICK) 297 (90 Base) MCG/ACT AEPB Inhale 2 puffs into the lungs 4 (four) times daily as needed.  Marland Kitchen BIOTIN PO Take by mouth daily.    . Budesonide (PULMICORT FLEXHALER) 90 MCG/ACT inhaler Inhale 2 puffs into the lungs 2 (two) times daily.  . Calcium Carbonate-Vitamin D (CALCIUM 600+D) 600-200 MG-UNIT TABS Take 1 tablet by mouth daily. Take 500 mg daily  . cetirizine (KLS ALLER-TEC) 10 MG tablet Take 10 mg by mouth daily.  . Fiber, Guar Gum, CHEW Chew 2 each by  mouth daily.  Marland Kitchen lisinopril (ZESTRIL) 5 MG tablet Take 1 tablet (5 mg total) by mouth daily.  Marland Kitchen omeprazole (PRILOSEC) 20 MG capsule Take 1 capsule (20 mg total) by mouth daily.  . [DISCONTINUED] Albuterol Sulfate (PROAIR RESPICLICK) 989 (90 Base) MCG/ACT AEPB Inhale 2 puffs into the lungs 4 (four) times daily as needed.  . [DISCONTINUED] lisinopril (PRINIVIL,ZESTRIL) 10 MG tablet Take 1 tablet (10 mg total) by mouth daily.  . [DISCONTINUED] omeprazole (PRILOSEC) 20 MG capsule Take 1 capsule (20 mg total) by mouth daily.  . [DISCONTINUED] PULMICORT FLEXHALER 90 MCG/ACT inhaler INHALE 2 PUFFS TWICE DAILY   Social History   Tobacco Use  . Smoking status: Former Smoker    Types: Cigarettes    Quit date: 08/30/1978    Years since quitting: 40.5  . Smokeless tobacco: Never Used  Substance Use Topics  . Alcohol use: Yes    Alcohol/week: 14.0 standard drinks    Types: 14 Standard drinks or equivalent per week   Family History  Problem Relation Age of Onset  . Cancer Mother        skin - precancerous  . High blood pressure Father   . Other Father        heart valve replacement; rheumatic heart  . CAD Father   . Autoimmune disease Brother        autoimmune necrotizine neuropathy; followed at [facility]  .  Cancer Maternal Grandmother        tongue  . Cancer Maternal Grandfather   . Heart failure Paternal Grandmother      Review of Systems  Constitutional: Negative for activity change, appetite change, chills, fatigue, fever and unexpected weight change.  HENT: Negative for congestion, ear pain, hearing loss, sinus pressure, sinus pain, sore throat and trouble swallowing.   Eyes: Negative for pain and visual disturbance.  Respiratory: Negative for cough, chest tightness, shortness of breath and wheezing.   Cardiovascular: Negative for chest pain, palpitations and leg swelling.  Gastrointestinal: Negative for abdominal pain, blood in stool, constipation, diarrhea, nausea and vomiting.   Genitourinary: Negative for difficulty urinating and menstrual problem.  Musculoskeletal: Negative for arthralgias and back pain.  Skin: Negative for rash.  Neurological: Negative for dizziness, weakness, numbness and headaches.  Hematological: Negative for adenopathy. Does not bruise/bleed easily.  Psychiatric/Behavioral: Negative for sleep disturbance and suicidal ideas. The patient is not nervous/anxious.     CBC:  Lab Results  Component Value Date   WBC 4.6 11/03/2018   HGB 12.6 11/03/2018   HCT 38.1 11/03/2018   MCH 32.9 07/17/2018   MCHC 33.0 11/03/2018   RDW 13.5 11/03/2018   PLT 280.0 11/03/2018   MPV 9.3 07/17/2018   CMP: Lab Results  Component Value Date   NA 136 03/07/2019   K 4.8 03/07/2019   CL 101 03/07/2019   CO2 21 03/07/2019   ANIONGAP 12 05/17/2016   GLUCOSE 97 03/07/2019   BUN 25 (H) 03/07/2019   CREATININE 1.23 (H) 03/07/2019   CREATININE 1.41 (H) 08/10/2018   GFRAA >60 05/17/2016   CALCIUM 9.4 03/07/2019   PROT 6.8 11/03/2018   BILITOT 0.6 11/03/2018   ALKPHOS 59 11/03/2018   ALT 15 11/03/2018   AST 16 11/03/2018   LIPID: Lab Results  Component Value Date   CHOL 182 11/03/2018   TRIG 268.0 (H) 11/03/2018   HDL 56.90 11/03/2018   LDLCALC 67 08/12/2015    Objective:  BP 90/60 (BP Location: Left Arm, Patient Position: Sitting, Cuff Size: Normal)   Pulse 78   Temp 98.5 F (36.9 C) (Temporal)   Ht 5' 2.5" (1.588 m)   Wt 161 lb 14.4 oz (73.4 kg)   LMP 10/13/2011   BMI 29.14 kg/m   Weight: 161 lb 14.4 oz (73.4 kg)   BP Readings from Last 3 Encounters:  03/07/19 90/60  10/20/18 130/68  07/15/18 97/74   Wt Readings from Last 3 Encounters:  03/07/19 161 lb 14.4 oz (73.4 kg)  10/20/18 172 lb 4.8 oz (78.2 kg)  03/22/18 174 lb (78.9 kg)    Physical Exam Constitutional:      General: She is not in acute distress.    Appearance: She is well-developed.  HENT:     Head: Normocephalic and atraumatic.     Right Ear: External ear  normal.     Left Ear: External ear normal.     Mouth/Throat:     Pharynx: No oropharyngeal exudate.  Eyes:     Conjunctiva/sclera: Conjunctivae normal.     Pupils: Pupils are equal, round, and reactive to light.  Neck:     Musculoskeletal: Normal range of motion and neck supple.     Thyroid: No thyromegaly.  Cardiovascular:     Rate and Rhythm: Normal rate and regular rhythm.     Heart sounds: Normal heart sounds. No murmur. No friction rub. No gallop.   Pulmonary:     Effort: Pulmonary effort is  normal.     Breath sounds: Normal breath sounds.  Abdominal:     General: Bowel sounds are normal. There is no distension.     Palpations: Abdomen is soft. There is no mass.     Tenderness: There is no abdominal tenderness. There is no guarding.     Hernia: No hernia is present.  Musculoskeletal: Normal range of motion.        General: No tenderness or deformity.  Lymphadenopathy:     Cervical: No cervical adenopathy.  Skin:    General: Skin is warm and dry.     Findings: No rash.  Neurological:     Mental Status: She is alert and oriented to person, place, and time.     Deep Tendon Reflexes: Reflexes normal.     Reflex Scores:      Tricep reflexes are 2+ on the right side and 2+ on the left side.      Bicep reflexes are 2+ on the right side and 2+ on the left side.      Brachioradialis reflexes are 2+ on the right side and 2+ on the left side.      Patellar reflexes are 2+ on the right side and 2+ on the left side. Psychiatric:        Speech: Speech normal.        Behavior: Behavior normal.        Thought Content: Thought content normal.     Assessment/Plan: Health Maintenance Due  Topic Date Due  . Hepatitis C Screening  Dec 30, 1960   Health Maintenance reviewed -  Declined shingrix today; otherwise up to date.  1. Preventative health care She is up-to-date with routine health screenings.  Follow-up with OB/GYN in early fall and will get mammogram at that time.  2.  Essential hypertension Blood pressure is low today.  She is working on weight loss and healthier eating.  Hope is that she may be able to come off blood pressure medication entirely, but we will start by decreasing to half tablet.  At previous visit we were able to drop the hydrochlorothiazide component as there is some concern that this may have contributed to gout.  He is not able to completely come off the lisinopril, could consider switching to medication less likely to increase uric acid levels like losartan. - lisinopril (ZESTRIL) 5 MG tablet; Take 1 tablet (5 mg total) by mouth daily.  Dispense: 90 tablet; Refill: 1 - Basic metabolic panel; Future - Basic metabolic panel  3. Mild intermittent asthma without complication Has had good asthma control with regular Pulmicort use.  We will continue to monitor. - Budesonide (PULMICORT FLEXHALER) 90 MCG/ACT inhaler; Inhale 2 puffs into the lungs 2 (two) times daily.  Dispense: 1 each; Refill: 11 - Albuterol Sulfate (PROAIR RESPICLICK) 591 (90 Base) MCG/ACT AEPB; Inhale 2 puffs into the lungs 4 (four) times daily as needed.  Dispense: 1 each; Refill: 5  4. Allergic state, subsequent encounter Controlled with as needed over-the-counter medications.  5. Elevated uric acid in blood Recheck levels today. - Uric acid; Future - Uric acid  6. Hyperglycemia Continue to work on healthy eating, lower carbohydrate eating.  Regular exercise is very important. - Hemoglobin A1c; Future - Hemoglobin A1c  7. Gastroesophageal reflux disease without esophagitis - omeprazole (PRILOSEC) 20 MG capsule; Take 1 capsule (20 mg total) by mouth daily.  Dispense: 90 capsule; Refill: 3  8. Encounter for hepatitis C screening test for low risk patient - Hep C  Antibody; Future - Hep C Antibody   Return in about 6 months (around 09/07/2019) for Chronic condition visit.  Micheline Rough, MD

## 2019-03-08 LAB — HEPATITIS C ANTIBODY
Hepatitis C Ab: NONREACTIVE
SIGNAL TO CUT-OFF: 0.01 (ref <1.00)

## 2019-04-25 ENCOUNTER — Encounter: Payer: Self-pay | Admitting: Family Medicine

## 2019-04-26 ENCOUNTER — Encounter: Payer: Self-pay | Admitting: Family Medicine

## 2019-06-14 ENCOUNTER — Other Ambulatory Visit: Payer: Self-pay

## 2019-06-14 ENCOUNTER — Ambulatory Visit (INDEPENDENT_AMBULATORY_CARE_PROVIDER_SITE_OTHER): Payer: Commercial Managed Care - PPO

## 2019-06-14 DIAGNOSIS — Z23 Encounter for immunization: Secondary | ICD-10-CM

## 2019-07-10 NOTE — Progress Notes (Signed)
Virtual Visit via Video Note  I connected with Brandy Todd 07/11/19  at  8:45 AM EST by a video enabled telemedicine application and verified that I am speaking with the correct person using two identifiers.  Location patient: home Location provider:work or home office Persons participating in the virtual visit: patient, provider  I discussed the limitations of evaluation and management by telemedicine and the availability of in person appointments. The patient expressed understanding and agreed to proceed.   Brandy Todd DOB: Oct 30, 1960 Encounter date: 07/11/2019  This is a 58 y.o. female who presents with Chief Complaint  Patient presents with  . Follow-up    History of present illness: At her last visit we discussed working on weight loss and healthier eating as it was her goal to get off blood pressure medications.  Hydrochlorothiazide was stopped, but she was continued on 5 mg daily of lisinopril.  Because her GFR was decreased and her uric acid levels were elevated, she elected to follow-up in 3 months time to reassess need for blood pressure medicine and to recheck blood work.  Son is in middle of chemo for testicular cancer.   Blood pressures 120's/mid 70's. Yesterday was 124/84. Most in this range. Just taking the 5mg  of the lisinopril right now.   Had one flare of gout - after eating more seafood. Thought wine might contribute as well. Did some time without drinking, but still felt that discomfort was still present. Didn't keep her from doing anything, just had some tenderness there. Drinking increased with staying home with COVID, but hasn't had any additional outbreaks. Still has "feeling" in foot. Gets red and a little swollen with the flare. Takes aleve, ices it and it goes away.   No Known Allergies Current Meds  Medication Sig  . Albuterol Sulfate (PROAIR RESPICLICK) 123XX123 (90 Base) MCG/ACT AEPB Inhale 2 puffs into the lungs 4 (four) times daily as needed.  Marland Kitchen BIOTIN  PO Take by mouth daily.    . Budesonide (PULMICORT FLEXHALER) 90 MCG/ACT inhaler Inhale 2 puffs into the lungs 2 (two) times daily.  . Calcium Carbonate-Vitamin D (CALCIUM 600+D) 600-200 MG-UNIT TABS Take 1 tablet by mouth daily. Take 500 mg daily  . cetirizine (KLS ALLER-TEC) 10 MG tablet Take 10 mg by mouth daily.  . Fiber, Guar Gum, CHEW Chew 2 each by mouth daily.  Marland Kitchen lisinopril (ZESTRIL) 5 MG tablet Take 1 tablet (5 mg total) by mouth daily.  Marland Kitchen omeprazole (PRILOSEC) 20 MG capsule Take 1 capsule (20 mg total) by mouth daily.    Review of Systems  Constitutional: Negative for chills, fatigue and fever.  Respiratory: Negative for cough, chest tightness, shortness of breath and wheezing.   Cardiovascular: Negative for chest pain, palpitations and leg swelling.    Objective:  LMP 10/13/2011       BP Readings from Last 3 Encounters:  03/07/19 90/60  10/20/18 130/68  07/15/18 97/74   Wt Readings from Last 3 Encounters:  03/07/19 161 lb 14.4 oz (73.4 kg)  10/20/18 172 lb 4.8 oz (78.2 kg)  03/22/18 174 lb (78.9 kg)    Physical Exam VITALS per patient if applicable:  GENERAL: alert, oriented, appears well and in no acute distress  HEENT: atraumatic, conjunctiva clear, no obvious abnormalities on inspection of external nose and ears  NECK: normal movements of the head and neck  LUNGS: on inspection no signs of respiratory distress, breathing rate appears normal, no obvious gross SOB, gasping or wheezing  CV: no  obvious cyanosis  MS: moves all visible extremities without noticeable abnormality  PSYCH/NEURO: pleasant and cooperative, no obvious depression or anxiety, speech and thought processing grossly intact  Skin: No obvious facial or neck abnormalities.  Assessment/Plan  1. Essential hypertension Well-controlled on lisinopril 5 mg daily dose. - Basic metabolic panel; Future  2. Elevated uric acid in blood We will plan to recheck blood work and see where her  baseline levels are now that she is off of her diuretic.Marland Kitchen  She would consider allopurinol, but would prefer not to take any additional medication. - Uric acid; Future  Return for pending blood work results.   I discussed the assessment and treatment plan with the patient. The patient was provided an opportunity to ask questions and all were answered. The patient agreed with the plan and demonstrated an understanding of the instructions.   The patient was advised to call back or seek an in-person evaluation if the symptoms worsen or if the condition fails to improve as anticipated.  I provided 17 minutes of non-face-to-face time during this encounter.    Micheline Rough, MD

## 2019-07-11 ENCOUNTER — Ambulatory Visit (INDEPENDENT_AMBULATORY_CARE_PROVIDER_SITE_OTHER): Payer: Commercial Managed Care - PPO | Admitting: Family Medicine

## 2019-07-11 ENCOUNTER — Other Ambulatory Visit: Payer: Self-pay

## 2019-07-11 ENCOUNTER — Encounter: Payer: Self-pay | Admitting: Family Medicine

## 2019-07-11 DIAGNOSIS — E790 Hyperuricemia without signs of inflammatory arthritis and tophaceous disease: Secondary | ICD-10-CM

## 2019-07-11 DIAGNOSIS — I10 Essential (primary) hypertension: Secondary | ICD-10-CM | POA: Diagnosis not present

## 2019-07-12 ENCOUNTER — Telehealth: Payer: Self-pay | Admitting: *Deleted

## 2019-07-12 NOTE — Telephone Encounter (Signed)
Copied from [place] [id number]. Topic: General - Inquiry >> Jul 12, 2019  9:56 AM Mathis Bud wrote: Reason for CRM: patient is calling stating she never got to do her virtual appt yesterday with PCP.  Patient states the service of the face time was not good.   Patient is requesting a call back from PCP nurse.  Patient believes the nurse can answer her questions.   Call back [phone]

## 2019-07-12 NOTE — Telephone Encounter (Signed)
Patient also stated she has an ace wrap on her leg and forgot to ask for another work note to be extended through December to work from home due to [place]? Patient requested a response be sent via Mychart if possible.  Message sent to Dr Ethlyn Gallery.

## 2019-07-12 NOTE — Telephone Encounter (Signed)
I called the pt and she stated she wanted to know if she was doing the correct thing to allow the hamstring to heal as she has used ice initially.  Later used heat and Aleve and stated the area is only painful and she decliines any swelling at this point.  Message sent to Dr Ethlyn Gallery.

## 2019-07-12 NOTE — Telephone Encounter (Signed)
-----   Message from Caren Macadam, MD sent at 07/11/2019  9:09 AM EST ----- Please schedule blood work visit for her

## 2019-07-12 NOTE — Telephone Encounter (Signed)
Lab appt scheduled for 11/18.

## 2019-07-13 ENCOUNTER — Encounter: Payer: Self-pay | Admitting: *Deleted

## 2019-07-13 NOTE — Telephone Encounter (Signed)
Mychart message sent with the information below. 

## 2019-07-13 NOTE — Telephone Encounter (Signed)
Can you find out more information (or send message in mychart if she wants to update that way) about hamstring? We didn't talk about this. We had difficulty with our connection, but I thought we had addressed all her concerns.   Happy to reply when I know more.   And I believe she just wants extension for being allowed to work from home due to [place] which is ok. Ok to update previous note.

## 2019-07-18 ENCOUNTER — Other Ambulatory Visit: Payer: Self-pay

## 2019-07-18 ENCOUNTER — Other Ambulatory Visit (INDEPENDENT_AMBULATORY_CARE_PROVIDER_SITE_OTHER): Payer: Commercial Managed Care - PPO

## 2019-07-18 DIAGNOSIS — E790 Hyperuricemia without signs of inflammatory arthritis and tophaceous disease: Secondary | ICD-10-CM

## 2019-07-18 DIAGNOSIS — I10 Essential (primary) hypertension: Secondary | ICD-10-CM

## 2019-07-18 LAB — URIC ACID: Uric Acid, Serum: 9 mg/dL — ABNORMAL HIGH (ref 2.4–7.0)

## 2019-07-18 LAB — BASIC METABOLIC PANEL
BUN: 20 mg/dL (ref 6–23)
CO2: 26 mEq/L (ref 19–32)
Calcium: 9.1 mg/dL (ref 8.4–10.5)
Chloride: 98 mEq/L (ref 96–112)
Creatinine, Ser: 1.25 mg/dL — ABNORMAL HIGH (ref 0.40–1.20)
GFR: 43.93 mL/min — ABNORMAL LOW (ref >60.00)
Glucose, Bld: 95 mg/dL (ref 70–99)
Potassium: 3.9 mEq/L (ref 3.5–5.1)
Sodium: 133 mEq/L — ABNORMAL LOW (ref 135–145)

## 2019-07-19 MED ORDER — ALLOPURINOL 100 MG PO TABS: 100.0000 mg | ORAL_TABLET | Freq: Every day | ORAL | 1 refills | Status: DC

## 2019-09-05 ENCOUNTER — Other Ambulatory Visit: Payer: Self-pay | Admitting: Family Medicine

## 2019-09-05 ENCOUNTER — Telehealth: Payer: Self-pay

## 2019-09-05 DIAGNOSIS — M1A00X0 Idiopathic chronic gout, unspecified site, without tophus (tophi): Secondary | ICD-10-CM

## 2019-09-05 DIAGNOSIS — I10 Essential (primary) hypertension: Secondary | ICD-10-CM

## 2019-09-05 DIAGNOSIS — R7989 Other specified abnormal findings of blood chemistry: Secondary | ICD-10-CM

## 2019-09-05 NOTE — Telephone Encounter (Signed)
Bloodwork ordered. Please set up lab visit. Drink plenty of water prior to labwork.

## 2019-09-05 NOTE — Telephone Encounter (Signed)
Copied from [place] [id number]. Topic: General - Inquiry >> Sep 05, 2019 12:57 PM Alease Frame wrote: Reason for CRM: Patient is calling to request blood work . No active request shown in chart . Please advise

## 2019-09-06 NOTE — Telephone Encounter (Signed)
Spoke with the pt and scheduled a lab appt for 10/16/19 to arrive at 1:30pm for a 1:40pm lab appt.

## 2019-09-07 ENCOUNTER — Encounter: Payer: Self-pay | Admitting: Family Medicine

## 2019-09-18 ENCOUNTER — Encounter: Payer: Self-pay | Admitting: Family Medicine

## 2019-09-18 ENCOUNTER — Other Ambulatory Visit: Payer: Commercial Managed Care - PPO

## 2019-09-19 NOTE — Telephone Encounter (Signed)
If she is not able to wear a mask for more than 30 minutes, and this may limit her ability to participate in jury duty?  I am happy to write a note either way, but she may want to check with them.  This also may be too late since I see message she sent was yesterday.

## 2019-10-16 ENCOUNTER — Other Ambulatory Visit: Payer: Commercial Managed Care - PPO

## 2019-10-16 ENCOUNTER — Other Ambulatory Visit: Payer: Self-pay

## 2019-10-17 ENCOUNTER — Other Ambulatory Visit (INDEPENDENT_AMBULATORY_CARE_PROVIDER_SITE_OTHER): Payer: Commercial Managed Care - PPO

## 2019-10-17 DIAGNOSIS — R7989 Other specified abnormal findings of blood chemistry: Secondary | ICD-10-CM

## 2019-10-17 DIAGNOSIS — M1A00X0 Idiopathic chronic gout, unspecified site, without tophus (tophi): Secondary | ICD-10-CM

## 2019-10-17 LAB — URIC ACID: Uric Acid, Serum: 5.4 mg/dL (ref 2.4–7.0)

## 2019-10-17 LAB — BASIC METABOLIC PANEL
BUN: 17 mg/dL (ref 6–23)
CO2: 26 mEq/L (ref 19–32)
Calcium: 8.9 mg/dL (ref 8.4–10.5)
Chloride: 100 mEq/L (ref 96–112)
Creatinine, Ser: 1.02 mg/dL (ref 0.40–1.20)
GFR: 55.49 mL/min — ABNORMAL LOW (ref >60.00)
Glucose, Bld: 117 mg/dL — ABNORMAL HIGH (ref 70–99)
Potassium: 3.8 mEq/L (ref 3.5–5.1)
Sodium: 132 mEq/L — ABNORMAL LOW (ref 135–145)

## 2019-11-21 ENCOUNTER — Other Ambulatory Visit: Payer: Self-pay | Admitting: Family Medicine

## 2019-11-21 DIAGNOSIS — I10 Essential (primary) hypertension: Secondary | ICD-10-CM

## 2019-12-05 ENCOUNTER — Telehealth (INDEPENDENT_AMBULATORY_CARE_PROVIDER_SITE_OTHER): Payer: Commercial Managed Care - PPO | Admitting: Family Medicine

## 2019-12-05 ENCOUNTER — Encounter: Payer: Self-pay | Admitting: Family Medicine

## 2019-12-05 VITALS — BP 125/78 | Temp 96.5°F | Wt 158.0 lb

## 2019-12-05 DIAGNOSIS — T7840XD Allergy, unspecified, subsequent encounter: Secondary | ICD-10-CM | POA: Diagnosis not present

## 2019-12-05 DIAGNOSIS — J4541 Moderate persistent asthma with (acute) exacerbation: Secondary | ICD-10-CM

## 2019-12-05 MED ORDER — PREDNISONE 20 MG PO TABS: 40.0000 mg | ORAL_TABLET | Freq: Every day | ORAL | 0 refills | Status: DC

## 2019-12-05 NOTE — Progress Notes (Signed)
Virtual Visit via Video Note  I connected with Brandy Todd  on 12/05/19 at  1:00 PM EDT by a video enabled telemedicine application and verified that I am speaking with the correct person using two identifiers.  Location patient: home Location provider:work or home office Persons participating in the virtual visit: patient, provider  I discussed the limitations of evaluation and management by telemedicine and the availability of in person appointments. The patient expressed understanding and agreed to proceed.   Brandy Todd DOB: [birth year] Encounter date: 12/05/2019  This is a 59 y.o. female who presents with Chief Complaint  Patient presents with  . Asthma    patient stated she had an asthma attack on  Monday night, stated she was outside for a while,used inhaler and still complains of wheezing and chest tightness today    History of present illness:  BP 125/78 Temp 96.5 Weight 158lb  Had asthma attack Monday night and needed to use rescue inhaler. Yesterday could breathe better, but usually recovers faster. Had to use rescue inhaler again last night and again this morning. Felt like she had to sleep in upright position to help with breathing.   Had second covid shot on Friday.   Sx started on Thursday when weather got cold; but then seemed to adjust. Thinks related to high pollen count. In past asthma has been triggered by allergies, but usually not this bad.   She is coughing and has some tightness in back. Cough is dry and hacking. Out of breath when she goes up a flight of stairs.   Does get some relief with albuterol, but doesn't like using it.   Never been hospitalized with asthma before. In past usually she is back at baseline the following day after just single inhaler use although more fatigued.   No Known Allergies Current Meds  Medication Sig  . Albuterol Sulfate (PROAIR RESPICLICK) [id number] (90 Base) MCG/ACT AEPB Inhale 2 puffs into the lungs 4 (four) times daily as  needed.  Marland Kitchen allopurinol (ZYLOPRIM) 100 MG tablet Take 1 tablet (100 mg total) by mouth daily.  Marland Kitchen BIOTIN PO Take by mouth daily.    . Budesonide (PULMICORT FLEXHALER) 90 MCG/ACT inhaler Inhale 2 puffs into the lungs 2 (two) times daily.  . Calcium Carbonate-Vitamin D (CALCIUM 600+D) 600-200 MG-UNIT TABS Take 1 tablet by mouth daily. Take 500 mg daily  . cetirizine (KLS ALLER-TEC) 10 MG tablet Take 10 mg by mouth daily.  . Fiber, Guar Gum, CHEW Chew 2 each by mouth daily.  Marland Kitchen lisinopril (ZESTRIL) 5 MG tablet TAKE 1 TABLET ONCE DAILY.  Marland Kitchen omeprazole (PRILOSEC) 20 MG capsule Take 1 capsule (20 mg total) by mouth daily.    Review of Systems  Constitutional: Negative for chills, fatigue and fever.  HENT: Positive for congestion (seasonal allergies). Negative for sinus pressure and sore throat.   Respiratory: Positive for cough, chest tightness, shortness of breath and wheezing.   Cardiovascular: Negative for chest pain, palpitations and leg swelling.    Objective:  LMP 10/13/2011       BP Readings from Last 3 Encounters:  03/07/19 90/60  10/20/18 130/68  07/15/18 97/74   Wt Readings from Last 3 Encounters:  03/07/19 161 lb 14.4 oz (73.4 kg)  10/20/18 172 lb 4.8 oz (78.2 kg)  03/22/18 174 lb (78.9 kg)    EXAM:  GENERAL: alert, oriented, appears well and in no acute distress  HEENT: atraumatic, conjunctiva clear, no obvious abnormalities on inspection of external nose  and ears  NECK: normal movements of the head and neck  LUNGS: on inspection no signs of respiratory distress, breathing rate appears normal, no obvious gross SOB, gasping or wheezing. Voice is somewhat hoarse. She does have occasional dry cough.  CV: no obvious cyanosis  MS: moves all visible extremities without noticeable abnormality  PSYCH/NEURO: pleasant and cooperative, no obvious depression or anxiety, speech and thought processing grossly intact  Assessment/Plan  1. Allergy, subsequent encounter Continue  with allergy medication. She does not like nasal sprays and so cannot add this for management.  2. Moderate persistent asthma with exacerbation Increase pulmicort to 2 puffs BID (currently just doing 1 puff daily) and start prednisone. Albuterol prn. Consider change to symbicort pending asthma control in future, but she has done well with pulmicort in recent years.   Let me know if any worsening or if not noting improvement on Friday.    Return if symptoms worsen or fail to improve.   I discussed the assessment and treatment plan with the patient. The patient was provided an opportunity to ask questions and all were answered. The patient agreed with the plan and demonstrated an understanding of the instructions.   The patient was advised to call back or seek an in-person evaluation if the symptoms worsen or if the condition fails to improve as anticipated.  I provided 18 minutes of non-face-to-face time during this encounter.   Micheline Rough, MD

## 2020-01-22 ENCOUNTER — Other Ambulatory Visit: Payer: Self-pay | Admitting: Family Medicine

## 2020-03-05 ENCOUNTER — Other Ambulatory Visit: Payer: Self-pay | Admitting: Family Medicine

## 2020-03-05 DIAGNOSIS — I10 Essential (primary) hypertension: Secondary | ICD-10-CM

## 2020-05-06 ENCOUNTER — Other Ambulatory Visit: Payer: Self-pay | Admitting: Family Medicine

## 2020-06-12 ENCOUNTER — Other Ambulatory Visit: Payer: Self-pay | Admitting: Family Medicine

## 2020-06-12 DIAGNOSIS — I10 Essential (primary) hypertension: Secondary | ICD-10-CM

## 2020-06-12 DIAGNOSIS — J4520 Mild intermittent asthma, uncomplicated: Secondary | ICD-10-CM

## 2020-06-12 DIAGNOSIS — K219 Gastro-esophageal reflux disease without esophagitis: Secondary | ICD-10-CM

## 2020-08-06 ENCOUNTER — Other Ambulatory Visit: Payer: Self-pay | Admitting: Family Medicine

## 2020-08-28 ENCOUNTER — Encounter: Payer: Self-pay | Admitting: Family Medicine

## 2020-09-17 LAB — HM DEXA SCAN

## 2020-09-24 ENCOUNTER — Other Ambulatory Visit: Payer: Self-pay | Admitting: Family Medicine

## 2020-09-24 DIAGNOSIS — K219 Gastro-esophageal reflux disease without esophagitis: Secondary | ICD-10-CM

## 2020-09-24 DIAGNOSIS — J4520 Mild intermittent asthma, uncomplicated: Secondary | ICD-10-CM

## 2020-10-21 ENCOUNTER — Other Ambulatory Visit: Payer: Self-pay

## 2020-10-22 ENCOUNTER — Ambulatory Visit (INDEPENDENT_AMBULATORY_CARE_PROVIDER_SITE_OTHER): Payer: Commercial Managed Care - PPO | Admitting: Family Medicine

## 2020-10-22 ENCOUNTER — Encounter: Payer: Self-pay | Admitting: Family Medicine

## 2020-10-22 VITALS — Temp 98.1°F | Ht 61.5 in | Wt 159.5 lb

## 2020-10-22 DIAGNOSIS — J4520 Mild intermittent asthma, uncomplicated: Secondary | ICD-10-CM | POA: Diagnosis not present

## 2020-10-22 DIAGNOSIS — T7840XD Allergy, unspecified, subsequent encounter: Secondary | ICD-10-CM

## 2020-10-22 DIAGNOSIS — E538 Deficiency of other specified B group vitamins: Secondary | ICD-10-CM

## 2020-10-22 DIAGNOSIS — Z0000 Encounter for general adult medical examination without abnormal findings: Secondary | ICD-10-CM | POA: Diagnosis not present

## 2020-10-22 DIAGNOSIS — E785 Hyperlipidemia, unspecified: Secondary | ICD-10-CM

## 2020-10-22 DIAGNOSIS — M1000 Idiopathic gout, unspecified site: Secondary | ICD-10-CM | POA: Diagnosis not present

## 2020-10-22 DIAGNOSIS — I10 Essential (primary) hypertension: Secondary | ICD-10-CM

## 2020-10-22 DIAGNOSIS — E559 Vitamin D deficiency, unspecified: Secondary | ICD-10-CM

## 2020-10-22 DIAGNOSIS — K219 Gastro-esophageal reflux disease without esophagitis: Secondary | ICD-10-CM

## 2020-10-22 NOTE — Progress Notes (Unsigned)
Brandy Todd DOB: [birth year] Encounter date: 10/22/2020  This is a 60 y.o. female who presents for complete physical   History of present illness/Additional concerns:  Lost dad last month (robert lysiak) and mom has been sick. Has help of siblings. Hasn't been checking bp at home at all.   Saw gyn in jan - has mammogram in [place] as well; told normal.   Due for colonoscopy in 12/2022  GERD: doing well with omeprazole 20mg .   Breathing has been doing well. Not used rescue inhaler in months. Using pulmicort twice daily.   Not exercising on regular basis. She would like to restart this.   Past Medical History:  Diagnosis Date  . ALLERGIC RHINITIS 01/23/2008  . ANXIETY 01/23/2008  . Anxiety state 01/23/2008   Qualifier: Diagnosis of  By: Burnice Logan  MD, Doretha Sou   . ASTHMA 01/23/2008  . GERD 01/23/2008  . Hot flashes, menopausal   . HYPERTENSION 09/12/2009  . PARESTHESIA 08/20/2008  . Squamous cell cancer of skin of upper arm 2019   right arm   Past Surgical History:  Procedure Laterality Date  . LASIK     Bil eyes  . SQUAMOUS CELL CARCINOMA EXCISION     No Known Allergies Current Meds  Medication Sig  . Albuterol Sulfate (PROAIR RESPICLICK) 659 (90 Base) MCG/ACT AEPB Inhale 2 puffs into the lungs 4 (four) times daily as needed.  Marland Kitchen allopurinol (ZYLOPRIM) 100 MG tablet TAKE 1 TABLET ONCE DAILY.  Marland Kitchen BIOTIN PO Take 10,000 mcg by mouth daily.  . Calcium Carbonate-Vitamin D 600-200 MG-UNIT TABS Take 1 tablet by mouth daily. Take 500 mg daily  . cetirizine (ZYRTEC) 10 MG tablet Take 10 mg by mouth daily.  . Fiber, Guar Gum, CHEW Chew 2 each by mouth daily.  Marland Kitchen lisinopril (ZESTRIL) 5 MG tablet TAKE 1 TABLET ONCE DAILY.  Marland Kitchen omeprazole (PRILOSEC) 20 MG capsule TAKE (1) CAPSULE DAILY.  Marland Kitchen PULMICORT FLEXHALER 90 MCG/ACT inhaler INHALE 2 PUFFS TWICE DAILY   Social History   Tobacco Use  . Smoking status: Former Smoker    Types: Cigarettes    Quit date: 08/30/1978    Years since quitting:  42.1  . Smokeless tobacco: Never Used  Substance Use Topics  . Alcohol use: Yes    Alcohol/week: 14.0 standard drinks    Types: 14 Standard drinks or equivalent per week   Family History  Problem Relation Age of Onset  . Cancer Mother        skin - precancerous  . High blood pressure Father   . Other Father        heart valve replacement; rheumatic heart  . CAD Father   . Autoimmune disease Brother        autoimmune necrotizine neuropathy; [facility]  . Cancer Maternal Grandmother        tongue  . Cancer Maternal Grandfather   . Heart failure Paternal Grandmother      Review of Systems  Constitutional: Negative for activity change, appetite change, chills, fatigue, fever and unexpected weight change.  HENT: Negative for congestion, ear pain, hearing loss, sinus pressure, sinus pain, sore throat and trouble swallowing.   Eyes: Negative for pain and visual disturbance.  Respiratory: Negative for cough, chest tightness, shortness of breath and wheezing.   Cardiovascular: Negative for chest pain, palpitations and leg swelling.  Gastrointestinal: Negative for abdominal pain, blood in stool, constipation, diarrhea, nausea and vomiting.  Genitourinary: Negative for difficulty urinating and menstrual problem.  Musculoskeletal:  Negative for arthralgias and back pain.  Skin: Negative for rash.  Neurological: Negative for dizziness, weakness, numbness and headaches.  Hematological: Negative for adenopathy. Does not bruise/bleed easily.  Psychiatric/Behavioral: Negative for sleep disturbance and suicidal ideas. The patient is not nervous/anxious.     CBC:  Lab Results  Component Value Date   WBC 5.5 10/22/2020   HGB 12.6 10/22/2020   HCT 38.0 10/22/2020   MCH 32.9 07/17/2018   MCHC 33.2 10/22/2020   RDW 14.0 10/22/2020   PLT 264.0 10/22/2020   MPV 9.3 07/17/2018   CMP: Lab Results  Component Value Date   NA 137 10/22/2020   K 3.9 10/22/2020   CL 100 10/22/2020   CO2  25 10/22/2020   ANIONGAP 12 05/17/2016   GLUCOSE 85 10/22/2020   BUN 16 10/22/2020   CREATININE 0.93 10/22/2020   CREATININE 1.41 (H) 08/10/2018   GFRAA >60 05/17/2016   CALCIUM 9.7 10/22/2020   PROT 7.2 10/22/2020   BILITOT 0.8 10/22/2020   ALKPHOS 73 10/22/2020   ALT 12 10/22/2020   AST 19 10/22/2020   LIPID: Lab Results  Component Value Date   CHOL 198 10/22/2020   TRIG 186.0 (H) 10/22/2020   HDL 78.40 10/22/2020   LDLCALC 82 10/22/2020    Objective:  Temp 98.1 F (36.7 C) (Oral)   Ht 5' 1.5" (1.562 m)   Wt 159 lb 8 oz (72.3 kg)   LMP 10/13/2011   BMI 29.65 kg/m   Weight: 159 lb 8 oz (72.3 kg)   BP Readings from Last 3 Encounters:  12/05/19 125/78  03/07/19 90/60  10/20/18 130/68   Wt Readings from Last 3 Encounters:  10/22/20 159 lb 8 oz (72.3 kg)  12/05/19 158 lb (71.7 kg)  03/07/19 161 lb 14.4 oz (73.4 kg)    Physical Exam Constitutional:      General: She is not in acute distress.    Appearance: She is well-developed and well-nourished.  HENT:     Head: Normocephalic and atraumatic.     Right Ear: External ear normal.     Left Ear: External ear normal.     Mouth/Throat:     Mouth: Oropharynx is clear and moist.     Pharynx: No oropharyngeal exudate.  Eyes:     Conjunctiva/sclera: Conjunctivae normal.     Pupils: Pupils are equal, round, and reactive to light.  Neck:     Thyroid: No thyromegaly.  Cardiovascular:     Rate and Rhythm: Normal rate and regular rhythm.     Heart sounds: Normal heart sounds. No murmur heard. No friction rub. No gallop.   Pulmonary:     Effort: Pulmonary effort is normal.     Breath sounds: Normal breath sounds.  Abdominal:     General: Bowel sounds are normal. There is no distension.     Palpations: Abdomen is soft. There is no mass.     Tenderness: There is no abdominal tenderness. There is no guarding.     Hernia: No hernia is present.  Musculoskeletal:        General: No tenderness, deformity or edema.  Normal range of motion.     Cervical back: Normal range of motion and neck supple.     Comments: Yellow bruising over left anterior shoulder (patient admits to a fall couple weeks ago disease. Mild tenderness over AC joint, but full range of motion no other tenderness on exam.  Lymphadenopathy:     Cervical: No cervical adenopathy.  Skin:  General: Skin is warm and dry.     Findings: No rash.  Neurological:     Mental Status: She is alert and oriented to person, place, and time.     Deep Tendon Reflexes: Strength normal. Reflexes normal.     Reflex Scores:      Tricep reflexes are 2+ on the right side and 2+ on the left side.      Bicep reflexes are 2+ on the right side and 2+ on the left side.      Brachioradialis reflexes are 2+ on the right side and 2+ on the left side.      Patellar reflexes are 2+ on the right side and 2+ on the left side. Psychiatric:        Mood and Affect: Mood and affect normal.        Speech: Speech normal.        Behavior: Behavior normal.        Thought Content: Thought content normal.     Assessment/Plan: There are no preventive care reminders to display for this patient. Health Maintenance reviewed.  1. Preventative health care She is up-to-date with preventive health care. Encouraged her to restart her exercise routine.  2. Essential hypertension Blood pressures well controlled today. Continue lisinopril 5 mg daily. - Comprehensive metabolic panel; Future - CBC with Differential/Platelet; Future - CBC with Differential/Platelet - Comprehensive metabolic panel  3. Mild intermittent asthma without complication Continue Pulmicort 2 puffs twice a day. Asthma has been well controlled.  4. Gastroesophageal reflux disease without esophagitis Continue Prilosec 20 mg daily. Reflux has been well controlled.  5. Idiopathic gout, unspecified chronicity, unspecified site Continue allopurinol 100 mg daily. Will recheck uric acid. - Uric acid;  Future - Uric acid  6. Allergy, subsequent encounter Continue Zyrtec. Allergies have been well controlled.  7. Vitamin D deficiency - VITAMIN D 25 Hydroxy (Vit-D Deficiency, Fractures); Future - VITAMIN D 25 Hydroxy (Vit-D Deficiency, Fractures)  8. B12 deficiency - Vitamin B12; Future - Vitamin B12  9. Hyperlipidemia, unspecified hyperlipidemia type - Lipid panel; Future - Lipid panel  Return in about 6 months (around 04/21/2021) for Chronic condition visit.  Micheline Rough, MD

## 2020-10-23 LAB — CBC WITH DIFFERENTIAL/PLATELET
Basophils Absolute: 0 10*3/uL (ref 0.0–0.1)
Basophils Relative: 0.5 % (ref 0.0–3.0)
Eosinophils Absolute: 0.2 10*3/uL (ref 0.0–0.7)
Eosinophils Relative: 3.4 % (ref 0.0–5.0)
HCT: 38 % (ref 36.0–46.0)
Hemoglobin: 12.6 g/dL (ref 12.0–15.0)
Lymphocytes Relative: 18.3 % (ref 12.0–46.0)
Lymphs Abs: 1 10*3/uL (ref 0.7–4.0)
MCHC: 33.2 g/dL (ref 30.0–36.0)
MCV: 99.7 fl (ref 78.0–100.0)
Monocytes Absolute: 0.5 10*3/uL (ref 0.1–1.0)
Monocytes Relative: 9.6 % (ref 3.0–12.0)
Neutro Abs: 3.7 10*3/uL (ref 1.4–7.7)
Neutrophils Relative %: 68.2 % (ref 43.0–77.0)
Platelets: 264 10*3/uL (ref 150.0–400.0)
RBC: 3.81 Mil/uL — ABNORMAL LOW (ref 3.87–5.11)
RDW: 14 % (ref 11.5–15.5)
WBC: 5.5 10*3/uL (ref 4.0–10.5)

## 2020-10-23 LAB — COMPREHENSIVE METABOLIC PANEL
ALT: 12 U/L (ref 0–35)
AST: 19 U/L (ref 0–37)
Albumin: 4.2 g/dL (ref 3.5–5.2)
Alkaline Phosphatase: 73 U/L (ref 39–117)
BUN: 16 mg/dL (ref 6–23)
CO2: 25 mEq/L (ref 19–32)
Calcium: 9.7 mg/dL (ref 8.4–10.5)
Chloride: 100 mEq/L (ref 96–112)
Creatinine, Ser: 0.93 mg/dL (ref 0.40–1.20)
GFR: 67.1 mL/min (ref >60.00)
Glucose, Bld: 85 mg/dL (ref 70–99)
Potassium: 3.9 mEq/L (ref 3.5–5.1)
Sodium: 137 mEq/L (ref 135–145)
Total Bilirubin: 0.8 mg/dL (ref 0.2–1.2)
Total Protein: 7.2 g/dL (ref 6.0–8.3)

## 2020-10-23 LAB — LIPID PANEL
Cholesterol: 198 mg/dL (ref 0–200)
HDL: 78.4 mg/dL (ref >39.00)
LDL Cholesterol: 82 mg/dL (ref 0–99)
NonHDL: 119.51
Total CHOL/HDL Ratio: 3
Triglycerides: 186 mg/dL — ABNORMAL HIGH (ref 0.0–149.0)
VLDL: 37.2 mg/dL (ref 0.0–40.0)

## 2020-10-23 LAB — VITAMIN B12: Vitamin B-12: 347 pg/mL (ref 211–911)

## 2020-10-23 LAB — URIC ACID: Uric Acid, Serum: 6 mg/dL (ref 2.4–7.0)

## 2020-10-23 LAB — VITAMIN D 25 HYDROXY (VIT D DEFICIENCY, FRACTURES): VITD: 57.75 ng/mL (ref 30.00–100.00)

## 2020-10-24 ENCOUNTER — Encounter: Payer: Self-pay | Admitting: Family Medicine

## 2020-10-27 ENCOUNTER — Other Ambulatory Visit: Payer: Self-pay | Admitting: Family Medicine

## 2020-10-27 DIAGNOSIS — I10 Essential (primary) hypertension: Secondary | ICD-10-CM

## 2020-10-27 DIAGNOSIS — K219 Gastro-esophageal reflux disease without esophagitis: Secondary | ICD-10-CM

## 2020-10-27 MED ORDER — OMEPRAZOLE 20 MG PO CPDR: DELAYED_RELEASE_CAPSULE | ORAL | 3 refills | Status: DC

## 2020-10-27 MED ORDER — ALLOPURINOL 100 MG PO TABS: 100.0000 mg | ORAL_TABLET | Freq: Every day | ORAL | 3 refills | Status: DC

## 2020-10-27 MED ORDER — LISINOPRIL 5 MG PO TABS: 5.0000 mg | ORAL_TABLET | Freq: Every day | ORAL | 3 refills | Status: DC

## 2020-10-29 ENCOUNTER — Other Ambulatory Visit: Payer: Self-pay | Admitting: Family Medicine

## 2020-10-29 DIAGNOSIS — J4520 Mild intermittent asthma, uncomplicated: Secondary | ICD-10-CM

## 2021-04-10 ENCOUNTER — Telehealth: Payer: Self-pay

## 2021-04-10 NOTE — Telephone Encounter (Signed)
Patient called wanting to know if she can get a shingrex vaccine she had Covid and not sure how long she should wait before getting the vaccine.

## 2021-04-13 NOTE — Telephone Encounter (Signed)
Patient informed of the message below.

## 2021-07-21 ENCOUNTER — Ambulatory Visit (INDEPENDENT_AMBULATORY_CARE_PROVIDER_SITE_OTHER): Payer: Commercial Managed Care - PPO

## 2021-07-21 DIAGNOSIS — Z23 Encounter for immunization: Secondary | ICD-10-CM | POA: Diagnosis not present

## 2021-09-21 LAB — HM MAMMOGRAPHY

## 2021-10-22 ENCOUNTER — Ambulatory Visit: Payer: Commercial Managed Care - PPO

## 2021-10-23 ENCOUNTER — Encounter: Payer: Self-pay | Admitting: Family Medicine

## 2021-10-23 ENCOUNTER — Ambulatory Visit (INDEPENDENT_AMBULATORY_CARE_PROVIDER_SITE_OTHER): Payer: Commercial Managed Care - PPO | Admitting: Family Medicine

## 2021-10-23 VITALS — BP 120/80 | HR 69 | Temp 97.8°F | Ht 61.5 in | Wt 162.1 lb

## 2021-10-23 DIAGNOSIS — K219 Gastro-esophageal reflux disease without esophagitis: Secondary | ICD-10-CM

## 2021-10-23 DIAGNOSIS — E785 Hyperlipidemia, unspecified: Secondary | ICD-10-CM

## 2021-10-23 DIAGNOSIS — M1000 Idiopathic gout, unspecified site: Secondary | ICD-10-CM

## 2021-10-23 DIAGNOSIS — Z23 Encounter for immunization: Secondary | ICD-10-CM

## 2021-10-23 DIAGNOSIS — I10 Essential (primary) hypertension: Secondary | ICD-10-CM | POA: Diagnosis not present

## 2021-10-23 DIAGNOSIS — J4520 Mild intermittent asthma, uncomplicated: Secondary | ICD-10-CM | POA: Diagnosis not present

## 2021-10-23 DIAGNOSIS — Z0000 Encounter for general adult medical examination without abnormal findings: Secondary | ICD-10-CM

## 2021-10-23 DIAGNOSIS — H8112 Benign paroxysmal vertigo, left ear: Secondary | ICD-10-CM

## 2021-10-23 DIAGNOSIS — K5900 Constipation, unspecified: Secondary | ICD-10-CM

## 2021-10-23 MED ORDER — LISINOPRIL 5 MG PO TABS: 5.0000 mg | ORAL_TABLET | Freq: Every day | ORAL | 3 refills | Status: DC

## 2021-10-23 MED ORDER — ALLOPURINOL 100 MG PO TABS: 100.0000 mg | ORAL_TABLET | Freq: Every day | ORAL | 3 refills | Status: DC

## 2021-10-23 MED ORDER — PROAIR RESPICLICK 108 (90 BASE) MCG/ACT IN AEPB: 2.0000 | INHALATION_SPRAY | Freq: Four times a day (QID) | RESPIRATORY_TRACT | 5 refills | Status: AC | PRN

## 2021-10-23 MED ORDER — MECLIZINE HCL 25 MG PO TABS: 25.0000 mg | ORAL_TABLET | Freq: Three times a day (TID) | ORAL | 2 refills | Status: DC | PRN

## 2021-10-23 MED ORDER — OMEPRAZOLE 20 MG PO CPDR: DELAYED_RELEASE_CAPSULE | ORAL | 3 refills | Status: DC

## 2021-10-23 MED ORDER — PULMICORT FLEXHALER 90 MCG/ACT IN AEPB: 2.0000 | INHALATION_SPRAY | Freq: Two times a day (BID) | RESPIRATORY_TRACT | 5 refills | Status: DC

## 2021-10-23 NOTE — Patient Instructions (Signed)
See below- can try epley or half somersault maneuvers to try and improve crystals. Look at [facility] on you tube- this talks you through exercises and shows video that explains what to do.      Benign Positional Vertigo  Vertigo is the feeling that you or your surroundings are moving when they are not. Benign positional vertigo is the most common form of vertigo. The cause of this condition is not serious (is benign). This condition is triggered by certain movements and positions (is positional). This condition can be dangerous if it occurs while you are doing something that could endanger you or others, such as driving. What are the causes? In many cases, the cause of this condition is not known. It may be caused by a disturbance in an area of the inner ear that helps your brain to sense movement and balance. This disturbance can be caused by a viral infection (labyrinthitis), head injury, or repetitive motion. What increases the risk? This condition is more likely to develop in: Women. People who are 71 years of age or older.   What are the signs or symptoms? Symptoms of this condition usually happen when you move your head or your eyes in different directions. Symptoms may start suddenly, and they usually last for less than a minute. Symptoms may include: Loss of balance and falling. Feeling like you are spinning or moving. Feeling like your surroundings are spinning or moving. Nausea and vomiting. Blurred vision. Dizziness. Involuntary eye movement (nystagmus).   Symptoms can be mild and cause only slight annoyance, or they can be severe and interfere with daily life. Episodes of benign positional vertigo may return (recur) over time, and they may be triggered by certain movements. Symptoms may improve over time. How is this diagnosed? This condition is usually diagnosed by medical history and a physical exam of the head, neck, and ears. You may be referred to a health care  provider who specializes in ear, nose, and throat (ENT) problems (otolaryngologist) or a provider who specializes in disorders of the nervous system (neurologist). You may have additional testing, including: MRI. A CT scan. Eye movement tests. Your health care provider may ask you to change positions quickly while he or she watches you for symptoms of benign positional vertigo, such as nystagmus. Eye movement may be tested with an electronystagmogram (ENG), caloric stimulation, the Dix-Hallpike test, or the roll test. An electroencephalogram (EEG). This records electrical activity in your brain. Hearing tests.   How is this treated? Usually, your health care provider will treat this by moving your head in specific positions to adjust your inner ear back to normal. Surgery may be needed in severe cases, but this is rare. In some cases, benign positional vertigo may resolve on its own in 2-4 weeks. Follow these instructions at home: Safety Move slowly.Avoid sudden body or head movements. Avoid driving. Avoid operating heavy machinery. Avoid doing any tasks that would be dangerous to you or others if a vertigo episode would occur. If you have trouble walking or keeping your balance, try using a cane for stability. If you feel dizzy or unstable, sit down right away. Return to your normal activities as told by your health care provider. Ask your health care provider what activities are safe for you. General instructions Take over-the-counter and prescription medicines only as told by your health care provider. Avoid certain positions or movements as told by your health care provider. Drink enough fluid to keep your urine clear or  pale yellow. Keep all follow-up visits as told by your health care provider. This is important. Contact a health care provider if: You have a fever. Your condition gets worse or you develop new symptoms. Your family or friends notice any behavioral changes. Your nausea  or vomiting gets worse. You have numbness or a pins and needles sensation. Get help right away if: You have difficulty speaking or moving. You are always dizzy. You faint. You develop severe headaches. You have weakness in your legs or arms. You have changes in your hearing or vision. You develop a stiff neck. You develop sensitivity to light. This information is not intended to replace advice given to you by your health care provider. Make sure you discuss any questions you have with your health care provider. Document Released: 05/24/2006 Document Revised: 01/22/2016 Document Reviewed: 12/09/2014 Elsevier Interactive Patient Education  2018 [employer].    How to Perform the [place], [place] vertigo description and maneuver  The Epley maneuver is an exercise that relieves symptoms of vertigo. Vertigo is the feeling that you or your surroundings are moving when they are not. When you feel vertigo, you may feel like the room is spinning and have trouble walking. Dizziness is a little different than vertigo. When you are dizzy, you may feel unsteady or light-headed. You can do this maneuver at home whenever you have symptoms of vertigo. You can do it up to 3 times a day until your symptoms go away. Even though the Epley maneuver may relieve your vertigo for a few weeks, it is possible that your symptoms will return. This maneuver relieves vertigo, but it does not relieve dizziness. What are the risks? If it is done correctly, the Epley maneuver is considered safe. Sometimes it can lead to dizziness or nausea that goes away after a short time. If you develop other symptoms, such as changes in vision, weakness, or numbness, stop doing the maneuver and call your health care provider. How to perform the Epley maneuver Sit on the edge of a bed or table with your back straight and your legs extended or hanging over the edge of the bed or table. Turn your head halfway toward  the affected ear or side. Lie backward quickly with your head turned until you are lying flat on your back. You may want to position a pillow under your shoulders. Hold this position for 30 seconds. You may experience an attack of vertigo. This is normal. Turn your head to the opposite direction until your unaffected ear is facing the floor. Hold this position for 30 seconds. You may experience an attack of vertigo. This is normal. Hold this position until the vertigo stops. Turn your whole body to the same side as your head. Hold for another 30 seconds. Sit back up. You can repeat this exercise up to 3 times a day. Follow these instructions at home: After doing the Epley maneuver, you can return to your normal activities. Ask your health care provider if there is anything you should do at home to prevent vertigo. He or she may recommend that you: Keep your head raised (elevated) with two or more pillows while you sleep. Do not sleep on the side of your affected ear. Get up slowly from bed. Avoid sudden movements during the day. Avoid extreme head movement, like looking up or bending over. Contact a health care provider if: Your vertigo gets worse. You have other symptoms, including: Nausea. Vomiting. Headache. Get help right  away if: You have vision changes. You have a severe or worsening headache or neck pain. You cannot stop vomiting. You have new numbness or weakness in any part of your body. Summary Vertigo is the feeling that you or your surroundings are moving when they are not. The Epley maneuver is an exercise that relieves symptoms of vertigo. If the Epley maneuver is done correctly, it is considered safe. You can do it up to 3 times a day. This information is not intended to replace advice given to you by your health care provider. Make sure you discuss any questions you have with your health care provider. Document Released: 08/21/2013 Document Revised: 07/06/2016 Document  Reviewed: 07/06/2016 Elsevier Interactive Patient Education  2017 [employer].

## 2021-10-23 NOTE — Addendum Note (Signed)
Addended by: Agnes Lawrence on: 10/23/2021 01:22 PM   Modules accepted: Orders

## 2021-10-23 NOTE — Progress Notes (Signed)
Brandy Todd DOB: [birth year] Encounter date: 10/23/2021  This is a 61 y.o. female who presents for complete physical   History of present illness/Additional concerns: Last visit was 1 year ago.  Follows regularly with gynecology.   Repeat colonoscopy due 12/2022.  Has been having trouble with vertigo - started in jan. Talked with gyn about this. Bp was good at that visit; said thought inner ear. No problems with standing/sitting. More with laying down, rolling to left side. Bed feels like it is spinning - lasts 2 seconds and then it is over. Occasionally if looking up for prolonged period of time she will note it too. Sometimes after getting out of bed, but better if takes her time. No falls.   Has been on omeprazole for a long time. Has not tried to come off this but willing to try. Started with acid reflux sx.  Has helped with asthma as well.  Has stool every time she goes to the bathroom. Not really solid. Sometimes hard to hold; hits her pretty quickly. No blood in stool. Been this way for a year. Some formed. 90 percent of time feels she empties ok. Not taking fiber. Rates stool 1 and 5 on [facility] stool chart. Stopped fiber, but can restart. She is a good water drinker. Starts with tea in morning, and then water through day.   She is getting new puppy. A yorkie. Will get her at 12 weeks.  Needs form completed for preventative care form.   Mammogram: done in jan with gyn.  Pap: done in jan  Past Medical History:  Diagnosis Date   ALLERGIC RHINITIS 01/23/2008   ANXIETY 01/23/2008   Anxiety state 01/23/2008   Qualifier: Diagnosis of  By: Burnice Logan  MD, Doretha Sou    ASTHMA 01/23/2008   GERD 01/23/2008   Hot flashes, menopausal    HYPERTENSION 09/12/2009   PARESTHESIA 08/20/2008   Squamous cell cancer of skin of upper arm 2019   right arm   Past Surgical History:  Procedure Laterality Date   LASIK     Bil eyes   SQUAMOUS CELL CARCINOMA EXCISION     No Known Allergies Current  Meds  Medication Sig   BIOTIN PO Take 10,000 mcg by mouth daily.   Calcium Carbonate-Vitamin D 600-200 MG-UNIT TABS Take 1 tablet by mouth daily. Take 500 mg daily   cetirizine (ZYRTEC) 10 MG tablet Take 10 mg by mouth daily.   Fiber, Guar Gum, CHEW Chew 2 each by mouth daily.   meclizine (ANTIVERT) 25 MG tablet Take 1 tablet (25 mg total) by mouth 3 (three) times daily as needed for dizziness.   [DISCONTINUED] Albuterol Sulfate (PROAIR RESPICLICK) 536 (90 Base) MCG/ACT AEPB Inhale 2 puffs into the lungs 4 (four) times daily as needed.   [DISCONTINUED] allopurinol (ZYLOPRIM) 100 MG tablet Take 1 tablet (100 mg total) by mouth daily.   [DISCONTINUED] lisinopril (ZESTRIL) 5 MG tablet Take 1 tablet (5 mg total) by mouth daily.   [DISCONTINUED] omeprazole (PRILOSEC) 20 MG capsule TAKE (1) CAPSULE DAILY.   [DISCONTINUED] PULMICORT FLEXHALER 90 MCG/ACT inhaler INHALE 2 PUFFS TWICE DAILY   Social History   Tobacco Use   Smoking status: Former    Types: Cigarettes    Quit date: 08/30/1978    Years since quitting: 43.1   Smokeless tobacco: Never  Substance Use Topics   Alcohol use: Yes    Alcohol/week: 14.0 standard drinks    Types: 14 Standard drinks or equivalent per week  Family History  Problem Relation Age of Onset   Cancer Mother        skin - precancerous   High blood pressure Father    Other Father        heart valve replacement; rheumatic heart   CAD Father    Autoimmune disease Brother        autoimmune necrotizine neuropathy; followed at [facility] Maternal Grandmother        tongue   Cancer Maternal Grandfather    Heart failure Paternal Grandmother      Review of Systems  Constitutional:  Negative for activity change, appetite change, chills, fatigue, fever and unexpected weight change.  HENT:  Negative for congestion, ear pain, hearing loss, sinus pressure, sinus pain, sore throat and trouble swallowing.   Eyes:  Negative for pain and visual disturbance.   Respiratory:  Negative for cough, chest tightness, shortness of breath and wheezing.   Cardiovascular:  Negative for chest pain, palpitations and leg swelling.  Gastrointestinal:  Positive for constipation. Negative for abdominal pain, blood in stool, diarrhea, nausea and vomiting.  Genitourinary:  Negative for difficulty urinating and menstrual problem.  Musculoskeletal:  Negative for arthralgias and back pain.  Skin:  Negative for rash.  Neurological:  Positive for dizziness. Negative for weakness, numbness and headaches.  Hematological:  Negative for adenopathy. Does not bruise/bleed easily.  Psychiatric/Behavioral:  Negative for sleep disturbance and suicidal ideas. The patient is not nervous/anxious.    CBC:  Lab Results  Component Value Date   WBC 5.5 10/22/2020   HGB 12.6 10/22/2020   HCT 38.0 10/22/2020   MCH 32.9 07/17/2018   MCHC 33.2 10/22/2020   RDW 14.0 10/22/2020   PLT 264.0 10/22/2020   MPV 9.3 07/17/2018   CMP: Lab Results  Component Value Date   NA 137 10/22/2020   K 3.9 10/22/2020   CL 100 10/22/2020   CO2 25 10/22/2020   ANIONGAP 12 05/17/2016   GLUCOSE 85 10/22/2020   BUN 16 10/22/2020   CREATININE 0.93 10/22/2020   CREATININE 1.41 (H) 08/10/2018   GFRAA >60 05/17/2016   CALCIUM 9.7 10/22/2020   PROT 7.2 10/22/2020   BILITOT 0.8 10/22/2020   ALKPHOS 73 10/22/2020   ALT 12 10/22/2020   AST 19 10/22/2020   LIPID: Lab Results  Component Value Date   CHOL 198 10/22/2020   TRIG 186.0 (H) 10/22/2020   HDL 78.40 10/22/2020   LDLCALC 82 10/22/2020    Objective:  BP 120/80 (BP Location: Left Arm, Patient Position: Sitting, Cuff Size: Normal)    Pulse 69    Temp 97.8 F (36.6 C) (Oral)    Ht 5' 1.5" (1.562 m)    Wt 162 lb 1.6 oz (73.5 kg)    LMP 10/13/2011    SpO2 99%    BMI 30.13 kg/m   Weight: 162 lb 1.6 oz (73.5 kg)   BP Readings from Last 3 Encounters:  10/23/21 120/80  12/05/19 125/78  03/07/19 90/60   Wt Readings from Last 3 Encounters:   10/23/21 162 lb 1.6 oz (73.5 kg)  10/22/20 159 lb 8 oz (72.3 kg)  12/05/19 158 lb (71.7 kg)    Physical Exam Constitutional:      General: She is not in acute distress.    Appearance: She is well-developed.  HENT:     Head: Normocephalic and atraumatic.     Right Ear: External ear normal.     Left Ear: External ear normal.  Mouth/Throat:     Pharynx: No oropharyngeal exudate.  Eyes:     Conjunctiva/sclera: Conjunctivae normal.     Pupils: Pupils are equal, round, and reactive to light.  Neck:     Thyroid: No thyromegaly.  Cardiovascular:     Rate and Rhythm: Normal rate and regular rhythm.     Heart sounds: Normal heart sounds. No murmur heard.   No friction rub. No gallop.  Pulmonary:     Effort: Pulmonary effort is normal.     Breath sounds: Normal breath sounds.  Abdominal:     General: Bowel sounds are normal. There is no distension.     Palpations: Abdomen is soft. There is no mass.     Tenderness: There is no abdominal tenderness. There is no guarding.     Hernia: No hernia is present.  Musculoskeletal:        General: No tenderness or deformity. Normal range of motion.     Cervical back: Normal range of motion and neck supple.  Lymphadenopathy:     Cervical: No cervical adenopathy.  Skin:    General: Skin is warm and dry.     Findings: No rash.  Neurological:     Mental Status: She is alert and oriented to person, place, and time.     Deep Tendon Reflexes: Reflexes normal.     Reflex Scores:      Tricep reflexes are 2+ on the right side and 2+ on the left side.      Bicep reflexes are 2+ on the right side and 2+ on the left side.      Brachioradialis reflexes are 2+ on the right side and 2+ on the left side.      Patellar reflexes are 2+ on the right side and 2+ on the left side. Psychiatric:        Speech: Speech normal.        Behavior: Behavior normal.        Thought Content: Thought content normal.    Assessment/Plan: Health Maintenance Due   Topic Date Due   Zoster Vaccines- Shingrix (2 of 2) 09/15/2021   Health Maintenance reviewed -requesting paperwork from gynecology for mammogram and Pap smear done this year..   1. Preventative health care Keep up with regular exercise.  Continue with healthy eating.  Up-to-date with immunizations.  2. Essential hypertension Well-controlled.  Continue current medication. - CBC with Differential/Platelet; Future - Comprehensive metabolic panel; Future - lisinopril (ZESTRIL) 5 MG tablet; Take 1 tablet (5 mg total) by mouth daily.  Dispense: 90 tablet; Refill: 3  3. Mild intermittent asthma without complication Has been well controlled.  Refilling Pulmicort. - Budesonide (PULMICORT FLEXHALER) 90 MCG/ACT inhaler; Inhale 2 puffs into the lungs 2 (two) times daily.  Dispense: 1 each; Refill: 5 - Albuterol Sulfate (PROAIR RESPICLICK) 182 (90 Base) MCG/ACT AEPB; Inhale 2 puffs into the lungs 4 (four) times daily as needed.  Dispense: 1 each; Refill: 5  4. Gastroesophageal reflux disease without esophagitis She is going to try to decrease to every other day with the Prilosec.  If reflux symptoms return then can get back to daily use. - omeprazole (PRILOSEC) 20 MG capsule; TAKE (1) CAPSULE DAILY.  Dispense: 90 capsule; Refill: 3 - Vitamin B12; Future - Folate; Future  5. Idiopathic gout, unspecified chronicity, unspecified site Has been well controlled since starting allopurinol.  She had multiple flares even with dietary changes in the past.  This is kept things at [place]. - Uric acid;  Future - allopurinol (ZYLOPRIM) 100 MG tablet; Take 1 tablet (100 mg total) by mouth daily.  Dispense: 90 tablet; Refill: 3  6. Constipation, unspecified constipation type Start back on fiber supplementation.  She has been more constipated with smaller stools.  We discussed how fiber can help bulk up in give more thorough evacuation of stools.  Let me know if this does not correct the problem.  She is due for  repeat colonoscopy in May/2024, but his stools are improving we can consider referral to GI sooner.  7. Hyperlipidemia, unspecified hyperlipidemia type - Lipid panel; Future  8. Benign paroxysmal positional vertigo of left ear Given Epley maneuvers and exercises to do at home.  Meclizine if desired, especially 30 minutes prior to doing exercises.  If not improving on her own can consider therapy. - meclizine (ANTIVERT) 25 MG tablet; Take 1 tablet (25 mg total) by mouth 3 (three) times daily as needed for dizziness.  Dispense: 30 tablet; Refill: 2    Return in about 6 months (around 04/22/2022) for Chronic condition visit.  Micheline Rough, MD

## 2021-10-26 ENCOUNTER — Other Ambulatory Visit (INDEPENDENT_AMBULATORY_CARE_PROVIDER_SITE_OTHER): Payer: Commercial Managed Care - PPO

## 2021-10-26 DIAGNOSIS — M1000 Idiopathic gout, unspecified site: Secondary | ICD-10-CM | POA: Diagnosis not present

## 2021-10-26 DIAGNOSIS — E785 Hyperlipidemia, unspecified: Secondary | ICD-10-CM

## 2021-10-26 DIAGNOSIS — K219 Gastro-esophageal reflux disease without esophagitis: Secondary | ICD-10-CM | POA: Diagnosis not present

## 2021-10-26 DIAGNOSIS — I10 Essential (primary) hypertension: Secondary | ICD-10-CM

## 2021-10-26 LAB — CBC WITH DIFFERENTIAL/PLATELET
Basophils Absolute: 0 10*3/uL (ref 0.0–0.1)
Basophils Relative: 1 % (ref 0.0–3.0)
Eosinophils Absolute: 0.1 10*3/uL (ref 0.0–0.7)
Eosinophils Relative: 2.8 % (ref 0.0–5.0)
HCT: 37.6 % (ref 36.0–46.0)
Hemoglobin: 12.5 g/dL (ref 12.0–15.0)
Lymphocytes Relative: 25.2 % (ref 12.0–46.0)
Lymphs Abs: 1.2 10*3/uL (ref 0.7–4.0)
MCHC: 33.2 g/dL (ref 30.0–36.0)
MCV: 96.8 fl (ref 78.0–100.0)
Monocytes Absolute: 0.9 10*3/uL (ref 0.1–1.0)
Monocytes Relative: 19.4 % — ABNORMAL HIGH (ref 3.0–12.0)
Neutro Abs: 2.5 10*3/uL (ref 1.4–7.7)
Neutrophils Relative %: 51.6 % (ref 43.0–77.0)
Platelets: 255 10*3/uL (ref 150.0–400.0)
RBC: 3.89 Mil/uL (ref 3.87–5.11)
RDW: 14 % (ref 11.5–15.5)
WBC: 4.8 10*3/uL (ref 4.0–10.5)

## 2021-10-26 LAB — COMPREHENSIVE METABOLIC PANEL
ALT: 14 U/L (ref 0–35)
AST: 19 U/L (ref 0–37)
Albumin: 4.1 g/dL (ref 3.5–5.2)
Alkaline Phosphatase: 65 U/L (ref 39–117)
BUN: 9 mg/dL (ref 6–23)
CO2: 25 mEq/L (ref 19–32)
Calcium: 9.2 mg/dL (ref 8.4–10.5)
Chloride: 104 mEq/L (ref 96–112)
Creatinine, Ser: 0.91 mg/dL (ref 0.40–1.20)
GFR: 68.39 mL/min (ref >60.00)
Glucose, Bld: 105 mg/dL — ABNORMAL HIGH (ref 70–99)
Potassium: 3.3 mEq/L — ABNORMAL LOW (ref 3.5–5.1)
Sodium: 139 mEq/L (ref 135–145)
Total Bilirubin: 0.6 mg/dL (ref 0.2–1.2)
Total Protein: 7.2 g/dL (ref 6.0–8.3)

## 2021-10-26 LAB — LIPID PANEL
Cholesterol: 180 mg/dL (ref 0–200)
HDL: 59.5 mg/dL (ref >39.00)
NonHDL: 120.85
Total CHOL/HDL Ratio: 3
Triglycerides: 234 mg/dL — ABNORMAL HIGH (ref 0.0–149.0)
VLDL: 46.8 mg/dL — ABNORMAL HIGH (ref 0.0–40.0)

## 2021-10-26 LAB — URIC ACID: Uric Acid, Serum: 5.8 mg/dL (ref 2.4–7.0)

## 2021-10-26 LAB — LDL CHOLESTEROL, DIRECT: Direct LDL: 93 mg/dL

## 2021-10-27 LAB — FOLATE: Folate: 14.1 ng/mL (ref >5.9)

## 2021-10-27 LAB — VITAMIN B12: Vitamin B-12: 209 pg/mL — ABNORMAL LOW (ref 211–911)

## 2021-10-29 ENCOUNTER — Other Ambulatory Visit: Payer: Self-pay | Admitting: Family Medicine

## 2021-10-29 DIAGNOSIS — E876 Hypokalemia: Secondary | ICD-10-CM

## 2021-10-29 DIAGNOSIS — E538 Deficiency of other specified B group vitamins: Secondary | ICD-10-CM

## 2021-12-02 ENCOUNTER — Other Ambulatory Visit (INDEPENDENT_AMBULATORY_CARE_PROVIDER_SITE_OTHER): Payer: Commercial Managed Care - PPO

## 2021-12-02 DIAGNOSIS — E538 Deficiency of other specified B group vitamins: Secondary | ICD-10-CM | POA: Diagnosis not present

## 2021-12-02 DIAGNOSIS — E876 Hypokalemia: Secondary | ICD-10-CM

## 2021-12-03 LAB — CBC WITH DIFFERENTIAL/PLATELET
Basophils Absolute: 0.1 10*3/uL (ref 0.0–0.1)
Basophils Relative: 1.1 % (ref 0.0–3.0)
Eosinophils Absolute: 0.1 10*3/uL (ref 0.0–0.7)
Eosinophils Relative: 1.7 % (ref 0.0–5.0)
HCT: 40.4 % (ref 36.0–46.0)
Hemoglobin: 13.3 g/dL (ref 12.0–15.0)
Lymphocytes Relative: 27.2 % (ref 12.0–46.0)
Lymphs Abs: 1.7 10*3/uL (ref 0.7–4.0)
MCHC: 32.9 g/dL (ref 30.0–36.0)
MCV: 96.7 fl (ref 78.0–100.0)
Monocytes Absolute: 0.5 10*3/uL (ref 0.1–1.0)
Monocytes Relative: 7.8 % (ref 3.0–12.0)
Neutro Abs: 4 10*3/uL (ref 1.4–7.7)
Neutrophils Relative %: 62.2 % (ref 43.0–77.0)
Platelets: 291 10*3/uL (ref 150.0–400.0)
RBC: 4.18 Mil/uL (ref 3.87–5.11)
RDW: 13.4 % (ref 11.5–15.5)
WBC: 6.4 10*3/uL (ref 4.0–10.5)

## 2021-12-03 LAB — BASIC METABOLIC PANEL
BUN: 18 mg/dL (ref 6–23)
CO2: 25 mEq/L (ref 19–32)
Calcium: 9.3 mg/dL (ref 8.4–10.5)
Chloride: 100 mEq/L (ref 96–112)
Creatinine, Ser: 1.04 mg/dL (ref 0.40–1.20)
GFR: 58.22 mL/min — ABNORMAL LOW (ref >60.00)
Glucose, Bld: 92 mg/dL (ref 70–99)
Potassium: 3.9 mEq/L (ref 3.5–5.1)
Sodium: 136 mEq/L (ref 135–145)

## 2021-12-03 LAB — VITAMIN B12: Vitamin B-12: 1248 pg/mL — ABNORMAL HIGH (ref 211–911)

## 2021-12-03 LAB — FOLATE: Folate: 7.8 ng/mL (ref >5.9)

## 2021-12-07 LAB — METHYLMALONIC ACID, SERUM: Methylmalonic Acid, Quant: 109 nmol/L (ref 87–318)

## 2021-12-07 LAB — HOMOCYSTEINE: Homocysteine: 17.4 umol/L — ABNORMAL HIGH (ref <10.4)

## 2022-02-22 ENCOUNTER — Encounter: Payer: Self-pay | Admitting: Internal Medicine

## 2022-04-05 LAB — HM PAP SMEAR: HM Pap smear: NEGATIVE

## 2022-06-29 ENCOUNTER — Ambulatory Visit (INDEPENDENT_AMBULATORY_CARE_PROVIDER_SITE_OTHER): Payer: Commercial Managed Care - PPO | Admitting: Family Medicine

## 2022-06-29 ENCOUNTER — Encounter: Payer: Self-pay | Admitting: Family Medicine

## 2022-06-29 VITALS — BP 132/82 | HR 100 | Temp 97.8°F | Ht 61.5 in | Wt 164.0 lb

## 2022-06-29 DIAGNOSIS — I10 Essential (primary) hypertension: Secondary | ICD-10-CM | POA: Diagnosis not present

## 2022-06-29 DIAGNOSIS — Z1211 Encounter for screening for malignant neoplasm of colon: Secondary | ICD-10-CM | POA: Diagnosis not present

## 2022-06-29 NOTE — Progress Notes (Signed)
Established Patient Office Visit  Subjective   Patient ID: Brandy Todd, female    DOB: 02-02-1961  Age: 61 y.o. MRN: [id number]  Chief Complaint  Patient presents with   Establish Care    Patient is here for transition of care visit.   Moderate persistent asthma-- pt reports she is taking 1 puff daily of her pulmicort inhaler instead of 2 puffs BID.   GERD-- pt is only taking her prilosec every other day instead of daily, states that her asthma was being exacerbated by her acid reflux. States that both of these problems are under control.  HTN -- BP in office performed and is well controlled. She  reports no side effects to the medications, no chest pain, SOB, dizziness or headaches. She has a BP cuff at home and is checking BP regularly, reports they are in the normal range.       Current Outpatient Medications  Medication Instructions   Albuterol Sulfate (PROAIR RESPICLICK) 893 (90 Base) MCG/ACT AEPB 2 puffs, Inhalation, 4 times daily PRN   allopurinol (ZYLOPRIM) 100 mg, Oral, Daily   BIOTIN PO 10,000 mcg, Oral, Daily,     Budesonide (PULMICORT FLEXHALER) 90 MCG/ACT inhaler 2 puffs, Inhalation, 2 times daily   Calcium Carbonate-Vitamin D 600-200 MG-UNIT TABS 1 tablet, Oral, Daily, Take 500 mg daily    cetirizine (ZYRTEC) 10 mg, Daily   Fiber, Guar Gum, CHEW 2 each, Daily   lisinopril (ZESTRIL) 5 mg, Oral, Daily   omeprazole (PRILOSEC) 20 MG capsule TAKE (1) CAPSULE DAILY.    Patient Active Problem List   Diagnosis Date Noted   Idiopathic gout 08/07/2018   Essential hypertension 09/12/2009   Allergies 01/23/2008   Asthma 01/23/2008   GERD 01/23/2008      Review of Systems  All other systems reviewed and are negative.     Objective:     BP 132/82 (BP Location: Left Arm, Patient Position: Sitting, Cuff Size: Normal)   Pulse 100   Temp 97.8 F (36.6 C) (Oral)   Ht 5' 1.5" (1.562 m)   Wt 164 lb (74.4 kg)   LMP 10/16/2010   SpO2 99%   BMI 30.49 kg/m     Physical Exam Vitals reviewed.  Constitutional:      Appearance: Normal appearance. She is well-groomed and normal weight.  Eyes:     Conjunctiva/sclera: Conjunctivae normal.  Neck:     Thyroid: No thyromegaly.  Cardiovascular:     Rate and Rhythm: Normal rate and regular rhythm.     Pulses: Normal pulses.     Heart sounds: S1 normal and S2 normal.  Pulmonary:     Effort: Pulmonary effort is normal.     Breath sounds: Normal breath sounds and air entry.  Musculoskeletal:     Right lower leg: No edema.     Left lower leg: No edema.  Neurological:     Mental Status: She is alert and oriented to person, place, and time. Mental status is at baseline.     Gait: Gait is intact.  Psychiatric:        Mood and Affect: Mood and affect normal.        Speech: Speech normal.        Behavior: Behavior normal.        Judgment: Judgment normal.      No results found for any visits on 06/29/22.    The 10-year ASCVD risk score (Arnett DK, et al., 2019) is: 4.6%  Assessment & Plan:   Problem List Items Addressed This Visit       Cardiovascular and Mediastinum   Essential hypertension    Current hypertension medications:       Sig   lisinopril (ZESTRIL) 5 MG tablet (Taking) Take 1 tablet (5 mg total) by mouth daily.  BP is well controlled on low dose lisinopril, will continue this medication as prescribed.      Other Visit Diagnoses     Colon cancer screening    -  Primary   Relevant Orders   Ambulatory referral to Gastroenterology       Return in about 4 months (around 10/25/2022) for Annual physical exam.    Farrel Conners, MD

## 2022-06-29 NOTE — Assessment & Plan Note (Signed)
Current hypertension medications:      Sig   lisinopril (ZESTRIL) 5 MG tablet (Taking) Take 1 tablet (5 mg total) by mouth daily.     BP is well controlled on low dose lisinopril, will continue this medication as prescribed.

## 2022-06-30 ENCOUNTER — Encounter: Payer: Self-pay | Admitting: Internal Medicine

## 2022-07-06 ENCOUNTER — Encounter: Payer: Self-pay | Admitting: Family Medicine

## 2022-08-03 ENCOUNTER — Ambulatory Visit (AMBULATORY_SURGERY_CENTER): Payer: Commercial Managed Care - PPO

## 2022-08-03 VITALS — Ht 61.0 in | Wt 162.0 lb

## 2022-08-03 DIAGNOSIS — Z1211 Encounter for screening for malignant neoplasm of colon: Secondary | ICD-10-CM

## 2022-08-03 MED ORDER — NA SULFATE-K SULFATE-MG SULF 17.5-3.13-1.6 GM/177ML PO SOLN: 1.0000 | Freq: Once | ORAL | 0 refills | Status: AC

## 2022-08-03 NOTE — Progress Notes (Signed)
No egg or soy allergy known to patient;  No issues known to pt with past sedation with any surgeries or procedures; Patient denies ever being told they had issues or difficulty with intubation;  No FH of Malignant Hyperthermia; Pt is not on diet pills; Pt is not on home 02;  Pt is not on blood thinners;  Pt denies issues with constipation- as long as she takes it daily- if not taken- patient reports diarrhea daily; No A fib or A flutter; Have any cardiac testing pending--NO Pt instructed to use Single[employer]com or GoodRx for a price reduction on prep;   Insurance verified during [facility] appt=[employer]  Patient's chart reviewed by Osvaldo Angst CNRA prior to previsit and patient appropriate for the [place].  Previsit completed and red dot placed by patient's name on their procedure day (on provider's schedule).

## 2022-09-02 ENCOUNTER — Encounter: Payer: Self-pay | Admitting: Internal Medicine

## 2022-09-03 ENCOUNTER — Ambulatory Visit (AMBULATORY_SURGERY_CENTER): Payer: Commercial Managed Care - PPO | Admitting: Internal Medicine

## 2022-09-03 ENCOUNTER — Encounter: Payer: Self-pay | Admitting: Internal Medicine

## 2022-09-03 VITALS — BP 119/73 | HR 71 | Temp 96.8°F | Resp 13 | Ht 61.0 in | Wt 162.0 lb

## 2022-09-03 DIAGNOSIS — Z1211 Encounter for screening for malignant neoplasm of colon: Secondary | ICD-10-CM | POA: Diagnosis not present

## 2022-09-03 DIAGNOSIS — K6389 Other specified diseases of intestine: Secondary | ICD-10-CM

## 2022-09-03 DIAGNOSIS — K635 Polyp of colon: Secondary | ICD-10-CM

## 2022-09-03 DIAGNOSIS — D120 Benign neoplasm of cecum: Secondary | ICD-10-CM

## 2022-09-03 MED ORDER — SODIUM CHLORIDE 0.9 % IV SOLN: 500.0000 mL | Freq: Once | INTRAVENOUS | Status: DC

## 2022-09-03 NOTE — Progress Notes (Signed)
GASTROENTEROLOGY PROCEDURE H&P NOTE   Primary Care Physician: Farrel Conners, MD    Reason for Procedure:   Colon cancer screening  Plan:    colonoscopy  Patient is appropriate for endoscopic procedure(s) in the ambulatory ([place]) setting.  The nature of the procedure, as well as the risks, benefits, and alternatives were carefully and thoroughly reviewed with the patient. Ample time for discussion and questions allowed. The patient understood, was satisfied, and agreed to proceed.     HPI: Brandy Todd is a 62 y.o. female who presents for screening colonoscopy.  Medical history as below.  Tolerated the prep.  No recent chest pain or shortness of breath.  No abdominal pain today.  Past Medical History:  Diagnosis Date   ALLERGIC RHINITIS 01/23/2008   ANXIETY 01/23/2008   Anxiety state 01/23/2008   Qualifier: Diagnosis of  By: Burnice Logan  MD, Doretha Sou    ASTHMA 01/23/2008   Asthma    inhaler daily and PRN   GERD 01/23/2008   on meds   Hot flashes, menopausal    HYPERTENSION 09/12/2009   on meds   PARESTHESIA 08/20/2008   Seasonal allergies    Squamous cell cancer of skin of upper arm 2019   right arm    Past Surgical History:  Procedure Laterality Date   COLONOSCOPY  2013   JMP-MAC-suprep(good)-tics   LASIK Bilateral 2014   SQUAMOUS CELL CARCINOMA EXCISION      Prior to Admission medications   Medication Sig Start Date End Date Taking? Authorizing Provider  allopurinol (ZYLOPRIM) 100 MG tablet Take 1 tablet (100 mg total) by mouth daily. 10/23/21  Yes Koberlein, Junell C, MD  BIOTIN PO Take 10,000 mcg by mouth daily.   Yes [provider]  Budesonide (PULMICORT FLEXHALER) 90 MCG/ACT inhaler Inhale 2 puffs into the lungs 2 (two) times daily. Patient taking differently: Inhale 2 puffs into the lungs daily. 10/23/21  Yes Koberlein, Steele Berg, MD  Calcium Carbonate-Vitamin D 600-200 MG-UNIT TABS Take 1 tablet by mouth daily. Take 500 mg daily   Yes  [provider]  cetirizine (ZYRTEC) 10 MG tablet Take 10 mg by mouth daily.   Yes [provider]  Cyanocobalamin (VITAMIN B-12 PO) Take 2,500 mcg by mouth daily at 6 (six) AM.   Yes [provider]  lisinopril (ZESTRIL) 5 MG tablet Take 1 tablet (5 mg total) by mouth daily. 10/23/21  Yes Koberlein, Junell C, MD  omeprazole (PRILOSEC) 20 MG capsule TAKE (1) CAPSULE DAILY. Patient taking differently: Take 20 mg by mouth every other day. 10/23/21  Yes Koberlein, Steele Berg, MD  Probiotic Product (PROBIOTIC PO) Take 94 mg by mouth daily at 6 (six) AM.   Yes [provider]  Albuterol Sulfate (PROAIR RESPICLICK) 263 (90 Base) MCG/ACT AEPB Inhale 2 puffs into the lungs 4 (four) times daily as needed. 10/23/21   Koberlein, Steele Berg, MD  Wheat Dextrin (BENEFIBER) POWD Take 1 Scoop by mouth daily at 6 (six) AM.    [provider]    Current Outpatient Medications  Medication Sig Dispense Refill   allopurinol (ZYLOPRIM) 100 MG tablet Take 1 tablet (100 mg total) by mouth daily. 90 tablet 3   BIOTIN PO Take 10,000 mcg by mouth daily.     Budesonide (PULMICORT FLEXHALER) 90 MCG/ACT inhaler Inhale 2 puffs into the lungs 2 (two) times daily. (Patient taking differently: Inhale 2 puffs into the lungs daily.) 1 each 5   Calcium Carbonate-Vitamin D 600-200 MG-UNIT  TABS Take 1 tablet by mouth daily. Take 500 mg daily     cetirizine (ZYRTEC) 10 MG tablet Take 10 mg by mouth daily.     Cyanocobalamin (VITAMIN B-12 PO) Take 2,500 mcg by mouth daily at 6 (six) AM.     lisinopril (ZESTRIL) 5 MG tablet Take 1 tablet (5 mg total) by mouth daily. 90 tablet 3   omeprazole (PRILOSEC) 20 MG capsule TAKE (1) CAPSULE DAILY. (Patient taking differently: Take 20 mg by mouth every other day.) 90 capsule 3   Probiotic Product (PROBIOTIC PO) Take 94 mg by mouth daily at 6 (six) AM.     Albuterol Sulfate (PROAIR RESPICLICK) 295 (90 Base) MCG/ACT AEPB Inhale 2 puffs into the lungs 4 (four)  times daily as needed. 1 each 5   Wheat Dextrin (BENEFIBER) POWD Take 1 Scoop by mouth daily at 6 (six) AM.     No current facility-administered medications for this visit.    Allergies as of 09/03/2022   (No Known Allergies)    Family History  Problem Relation Age of Onset   Cancer Mother        skin - precancerous   High blood pressure Father    Other Father        heart valve replacement; rheumatic heart   CAD Father    Autoimmune disease Brother        autoimmune necrotizine neuropathy; followed at [facility]   Cancer Maternal Grandmother        tongue   Cancer Maternal Grandfather    Heart failure Paternal Grandmother    Colon cancer Neg Hx    Colon polyps Neg Hx    Esophageal cancer Neg Hx    Stomach cancer Neg Hx    Rectal cancer Neg Hx     Social History   Socioeconomic History   Marital status: Married    Spouse name: Not on file   Number of children: Not on file   Years of education: Not on file   Highest education level: Not on file  Occupational History   Not on file  Tobacco Use   Smoking status: Former    Types: Cigarettes    Quit date: 08/30/1978    Years since quitting: 44.0   Smokeless tobacco: Never  Vaping Use   Vaping Use: Never used  Substance and Sexual Activity   Alcohol use: Yes    Alcohol/week: 14.0 - 21.0 standard drinks of alcohol    Types: 14 - 21 Glasses of wine per week   Drug use: No   Sexual activity: Not on file  Other Topics Concern   Not on file  Social History Narrative   Not on file   Social Determinants of Health   Financial Resource Strain: Not on file  Food Insecurity: Not on file  Transportation Needs: Not on file  Physical Activity: Not on file  Stress: Not on file  Social Connections: Not on file  Intimate Partner Violence: Not on file    Physical Exam: Vital signs in last 24 hours: _0  136/76   Pulse 75   Temp (!) 96.8 F (36 C) (Skin)   Resp 12   Ht _1  (1.549 m)   Wt 162 lb (73.5 kg)   LMP  10/16/2010   SpO2 100%   BMI 30.61 kg/m  GEN: NAD EYE: Sclerae anicteric ENT: MMM CV: Non-tachycardic Pulm: CTA b/l GI: Soft, NT/ND NEURO:  Alert & Oriented x 3   Zenovia Jarred, MD [facility]  09/03/2022 8:12 AM

## 2022-09-03 NOTE — Progress Notes (Signed)
Called to room to assist during endoscopic procedure.  Patient ID and intended procedure confirmed with present staff. Received instructions for my participation in the procedure from the performing physician.  

## 2022-09-03 NOTE — Progress Notes (Signed)
Pt's states no medical or surgical changes since previsit or office visit. VS assessed by D.T 

## 2022-09-03 NOTE — Progress Notes (Signed)
Report to pacu rn. Vss. Care resumed by rn. 

## 2022-09-03 NOTE — Op Note (Signed)
[place] Patient Name: Brandy Todd Procedure Date: 09/03/2022 8:00 AM MRN: [id number] Endoscopist: Jerene Bears , MD, [id number] Age: 62 Referring MD:  Date of Birth: 07-Feb-1961 Gender: Female Account #: [id number] Procedure:                Colonoscopy Indications:              Screening for colorectal malignant neoplasm, Last                            colonoscopy 10 years ago Medicines:                Monitored Anesthesia Care Procedure:                Pre-Anesthesia Assessment:                           - Prior to the procedure, a History and Physical                            was performed, and patient medications and                            allergies were reviewed. The patient's tolerance of                            previous anesthesia was also reviewed. The risks                            and benefits of the procedure and the sedation                            options and risks were discussed with the patient.                            All questions were answered, and informed consent                            was obtained. Prior Anticoagulants: The patient has                            taken no anticoagulant or antiplatelet agents. ASA                            Grade Assessment: II - A patient with mild systemic                            disease. After reviewing the risks and benefits,                            the patient was deemed in satisfactory condition to                            undergo the procedure.  After obtaining informed consent, the colonoscope                            was passed under direct vision. Throughout the                            procedure, the patient's blood pressure, pulse, and                            oxygen saturations were monitored continuously. The                            PCF-HQ190L Colonoscope was introduced through the                            anus and advanced to the cecum,  identified by                            appendiceal orifice and ileocecal valve. The                            colonoscopy was performed without difficulty. The                            patient tolerated the procedure well. The quality                            of the bowel preparation was good. The ileocecal                            valve, appendiceal orifice, and rectum were                            photographed. Scope In: 8:16:10 AM Scope Out: 8:28:31 AM Scope Withdrawal Time: 0 hours 9 minutes 26 seconds  Total Procedure Duration: 0 hours 12 minutes 21 seconds  Findings:                 The digital rectal exam was normal.                           A 3 mm polyp was found in the cecum. The polyp was                            sessile. The polyp was removed with a cold snare.                            Resection and retrieval were complete.                           Multiple medium-mouthed and small-mouthed                            diverticula were found in the sigmoid colon.  The retroflexed view of the distal rectum and anal                            verge was normal and showed no anal or rectal                            abnormalities. Complications:            No immediate complications. Estimated Blood Loss:     Estimated blood loss: none. Impression:               - One 3 mm polyp in the cecum, removed with a cold                            snare. Resected and retrieved.                           - Moderate diverticulosis in the sigmoid colon.                           - The distal rectum and anal verge are normal on                            retroflexion view. Recommendation:           - Patient has a contact number available for                            emergencies. The signs and symptoms of potential                            delayed complications were discussed with the                            patient. Return to normal activities  tomorrow.                            Written discharge instructions were provided to the                            patient.                           - Resume previous diet.                           - Continue present medications.                           - Await pathology results.                           - Repeat colonoscopy is recommended. The                            colonoscopy date will be determined after pathology  results from today's exam become available for                            review. Jerene Bears, MD 09/03/2022 8:33:37 AM This report has been signed electronically.

## 2022-09-03 NOTE — Patient Instructions (Signed)
  Patient has a contact number available for emergencies. The signs and symptoms of potential delayed complications were discussed with the patient. Return to normal activities tomorrow. Written discharge instructions were provided to the patient. - Resume previous diet. - Continue present medications. - Await pathology results. - Repeat colonoscopy is recommended. The colonoscopy  YOU HAD AN ENDOSCOPIC PROCEDURE TODAY: Refer to the procedure report and other information in the discharge instructions given to you for any specific questions about what was found during the examination. If this information does not answer your questions, please call [place] office at [phone] to clarify.   YOU SHOULD EXPECT: Some feelings of bloating in the abdomen. Passage of more gas than usual. Walking can help get rid of the air that was put into your GI tract during the procedure and reduce the bloating. If you had a lower endoscopy (such as a colonoscopy or flexible sigmoidoscopy) you may notice spotting of blood in your stool or on the toilet paper. Some abdominal soreness may be present for a day or two, also.  DIET: Your first meal following the procedure should be a light meal and then it is ok to progress to your normal diet. A half-sandwich or bowl of soup is an example of a good first meal. Heavy or fried foods are harder to digest and may make you feel nauseous or bloated. Drink plenty of fluids but you should avoid alcoholic beverages for 24 hours. If you had a esophageal dilation, please see attached instructions for diet.    ACTIVITY: Your care partner should take you home directly after the procedure. You should plan to take it easy, moving slowly for the rest of the day. You can resume normal activity the day after the procedure however YOU SHOULD NOT DRIVE, use power tools, machinery or perform tasks that involve climbing or major physical exertion for 24 hours (because of the sedation medicines  used during the test).   SYMPTOMS TO REPORT IMMEDIATELY: A gastroenterologist can be reached at any hour. Please call [phone]  for any of the following symptoms:  Following lower endoscopy (colonoscopy, flexible sigmoidoscopy) Excessive amounts of blood in the stool  Significant tenderness, worsening of abdominal pains  Swelling of the abdomen that is new, acute  Fever of 100 or higher   FOLLOW UP:  If any biopsies were taken you will be contacted by phone or by letter within the next 1-3 weeks. Call [phone]  if you have not heard about the biopsies in 3 weeks.  Please also call with any specific questions about appointments or follow up tests.

## 2022-09-06 ENCOUNTER — Telehealth: Payer: Self-pay | Admitting: *Deleted

## 2022-09-06 NOTE — Telephone Encounter (Signed)
  Follow up Call-     09/03/2022    7:08 AM  Call back number  Post procedure Call Back phone  # [phone]  Permission to leave phone message Yes     Patient questions:  Do you have a fever, pain , or abdominal swelling? No. Pain Score  0 *  Have you tolerated food without any problems? Yes.    Have you been able to return to your normal activities? No.  Do you have any questions about your discharge instructions: Diet   No. Medications  No. Follow up visit  No.  Do you have questions or concerns about your Care? No.  Actions: * If pain score is 4 or above: No action needed, pain <4.

## 2022-09-08 ENCOUNTER — Other Ambulatory Visit (HOSPITAL_COMMUNITY): Payer: Self-pay

## 2022-09-08 ENCOUNTER — Encounter: Payer: Self-pay | Admitting: Internal Medicine

## 2022-09-08 ENCOUNTER — Telehealth: Payer: Self-pay | Admitting: Family Medicine

## 2022-09-08 DIAGNOSIS — J4520 Mild intermittent asthma, uncomplicated: Secondary | ICD-10-CM

## 2022-09-08 NOTE — Telephone Encounter (Signed)
Pharmacy Patient Advocate Encounter   Received notification that prior authorization for Pulmicort Flexhaler 90MCG/ACT aerosol powder is required/requested.  Per Test Claim: Premium Drug Exclusion: Drug Not Covered   PA submitted on 09/08/22 to (ins) [employer] via [email or web address] Status is pending  Prior Auth (EOC) ID: [id number]

## 2022-09-08 NOTE — Telephone Encounter (Addendum)
Pt called to say the pharmacy is saying the PA is needed for the refill of the:  Pulmicort Flexhaler Budesonide (PULMICORT FLEXHALER) 90 MCG/ACT inhaler   LOV:  06/29/22  [place], [place] Phone: [phone]  Fax: [phone]

## 2022-09-09 NOTE — Telephone Encounter (Signed)
Pharmacy Patient Advocate Encounter  Received notification from [employer] that the request for prior authorization for Pulmicort Flexhaler 90MCG/ACT aerosol powder  has been denied due to the requested medication is non-formulary and is not covered.      You may call [phone] or fax [phone], to appeal.  Please be advised we currently do not have a Pharmacist to review denials. If you would like [place] to submit it on your behalf, please provide clinical information to support your reason for appeal and any pertinent information you would like [place] to include with the appeal request. Appeals may take longer 5 business days to be submitted as we prepares necessary documentation. Thanks for your support.  How would you like to proceed?   Denial letter attached to charts

## 2022-09-10 MED ORDER — ARNUITY ELLIPTA 100 MCG/ACT IN AEPB: 1.0000 | INHALATION_SPRAY | Freq: Every day | RESPIRATORY_TRACT | 5 refills | Status: DC

## 2022-09-10 NOTE — Telephone Encounter (Signed)
Spoke with the patient and informed her of the message below.  

## 2022-10-26 ENCOUNTER — Encounter: Payer: Commercial Managed Care - PPO | Admitting: Family Medicine

## 2022-11-04 LAB — HM MAMMOGRAPHY

## 2022-11-09 ENCOUNTER — Encounter: Payer: Self-pay | Admitting: Family Medicine

## 2022-11-09 ENCOUNTER — Ambulatory Visit (INDEPENDENT_AMBULATORY_CARE_PROVIDER_SITE_OTHER): Payer: Commercial Managed Care - PPO | Admitting: Family Medicine

## 2022-11-09 VITALS — BP 136/80 | HR 75 | Temp 97.5°F | Ht 61.5 in | Wt 166.0 lb

## 2022-11-09 DIAGNOSIS — K219 Gastro-esophageal reflux disease without esophagitis: Secondary | ICD-10-CM

## 2022-11-09 DIAGNOSIS — E785 Hyperlipidemia, unspecified: Secondary | ICD-10-CM

## 2022-11-09 DIAGNOSIS — I10 Essential (primary) hypertension: Secondary | ICD-10-CM | POA: Diagnosis not present

## 2022-11-09 DIAGNOSIS — J4520 Mild intermittent asthma, uncomplicated: Secondary | ICD-10-CM

## 2022-11-09 DIAGNOSIS — Z0000 Encounter for general adult medical examination without abnormal findings: Secondary | ICD-10-CM

## 2022-11-09 DIAGNOSIS — M1000 Idiopathic gout, unspecified site: Secondary | ICD-10-CM | POA: Diagnosis not present

## 2022-11-09 DIAGNOSIS — R7989 Other specified abnormal findings of blood chemistry: Secondary | ICD-10-CM

## 2022-11-09 DIAGNOSIS — Z23 Encounter for immunization: Secondary | ICD-10-CM

## 2022-11-09 LAB — COMPREHENSIVE METABOLIC PANEL
ALT: 18 U/L (ref 0–35)
AST: 28 U/L (ref 0–37)
Albumin: 4.2 g/dL (ref 3.5–5.2)
Alkaline Phosphatase: 78 U/L (ref 39–117)
BUN: 11 mg/dL (ref 6–23)
CO2: 22 mEq/L (ref 19–32)
Calcium: 9.3 mg/dL (ref 8.4–10.5)
Chloride: 101 mEq/L (ref 96–112)
Creatinine, Ser: 0.99 mg/dL (ref 0.40–1.20)
GFR: 61.37 mL/min (ref >60.00)
Glucose, Bld: 110 mg/dL — ABNORMAL HIGH (ref 70–99)
Potassium: 3.5 mEq/L (ref 3.5–5.1)
Sodium: 136 mEq/L (ref 135–145)
Total Bilirubin: 1 mg/dL (ref 0.2–1.2)
Total Protein: 7.7 g/dL (ref 6.0–8.3)

## 2022-11-09 LAB — LDL CHOLESTEROL, DIRECT: Direct LDL: 95 mg/dL

## 2022-11-09 LAB — LIPID PANEL
Cholesterol: 218 mg/dL — ABNORMAL HIGH (ref 0–200)
HDL: 87.3 mg/dL (ref >39.00)
NonHDL: 130.91
Total CHOL/HDL Ratio: 2
Triglycerides: 328 mg/dL — ABNORMAL HIGH (ref 0.0–149.0)
VLDL: 65.6 mg/dL — ABNORMAL HIGH (ref 0.0–40.0)

## 2022-11-09 LAB — TSH: TSH: 5.71 u[IU]/mL — ABNORMAL HIGH (ref 0.35–5.50)

## 2022-11-09 LAB — HM PAP SMEAR: HPV, high-risk: NEGATIVE

## 2022-11-09 MED ORDER — OMEPRAZOLE 20 MG PO CPDR: 20.0000 mg | DELAYED_RELEASE_CAPSULE | ORAL | 3 refills | Status: DC

## 2022-11-09 MED ORDER — LISINOPRIL 5 MG PO TABS: 5.0000 mg | ORAL_TABLET | Freq: Every day | ORAL | 3 refills | Status: DC

## 2022-11-09 MED ORDER — ARNUITY ELLIPTA 100 MCG/ACT IN AEPB: 1.0000 | INHALATION_SPRAY | Freq: Every day | RESPIRATORY_TRACT | 3 refills | Status: DC

## 2022-11-09 MED ORDER — ALLOPURINOL 100 MG PO TABS: 100.0000 mg | ORAL_TABLET | Freq: Every day | ORAL | 3 refills | Status: DC

## 2022-11-09 NOTE — Patient Instructions (Addendum)
RSV vaccine is due!  Health Maintenance, Female Adopting a healthy lifestyle and getting preventive care are important in promoting health and wellness. Ask your health care provider about: The right schedule for you to have regular tests and exams. Things you can do on your own to prevent diseases and keep yourself healthy. What should I know about diet, weight, and exercise? Eat a healthy diet  Eat a diet that includes plenty of vegetables, fruits, low-fat dairy products, and lean protein. Do not eat a lot of foods that are high in solid fats, added sugars, or sodium. Maintain a healthy weight Body mass index (BMI) is used to identify weight problems. It estimates body fat based on height and weight. Your health care provider can help determine your BMI and help you achieve or maintain a healthy weight. Get regular exercise Get regular exercise. This is one of the most important things you can do for your health. Most adults should: Exercise for at least 150 minutes each week. The exercise should increase your heart rate and make you sweat (moderate-intensity exercise). Do strengthening exercises at least twice a week. This is in addition to the moderate-intensity exercise. Spend less time sitting. Even light physical activity can be beneficial. Watch cholesterol and blood lipids Have your blood tested for lipids and cholesterol at 62 years of age, then have this test every 5 years. Have your cholesterol levels checked more often if: Your lipid or cholesterol levels are high. You are older than 62 years of age. You are at high risk for heart disease. What should I know about cancer screening? Depending on your health history and family history, you may need to have cancer screening at various ages. This may include screening for: Breast cancer. Cervical cancer. Colorectal cancer. Skin cancer. Lung cancer. What should I know about heart disease, diabetes, and high blood  pressure? Blood pressure and heart disease High blood pressure causes heart disease and increases the risk of stroke. This is more likely to develop in people who have high blood pressure readings or are overweight. Have your blood pressure checked: Every 3-5 years if you are 9-72 years of age. Every year if you are 20 years old or older. Diabetes Have regular diabetes screenings. This checks your fasting blood sugar level. Have the screening done: Once every three years after age 57 if you are at a normal weight and have a low risk for diabetes. More often and at a younger age if you are overweight or have a high risk for diabetes. What should I know about preventing infection? Hepatitis B If you have a higher risk for hepatitis B, you should be screened for this virus. Talk with your health care provider to find out if you are at risk for hepatitis B infection. Hepatitis C Testing is recommended for: Everyone born from [birth year] through 1965. Anyone with known risk factors for hepatitis C. Sexually transmitted infections (STIs) Get screened for STIs, including gonorrhea and chlamydia, if: You are sexually active and are younger than 62 years of age. You are older than 62 years of age and your health care provider tells you that you are at risk for this type of infection. Your sexual activity has changed since you were last screened, and you are at increased risk for chlamydia or gonorrhea. Ask your health care provider if you are at risk. Ask your health care provider about whether you are at high risk for HIV. Your health care provider may recommend a prescription  medicine to help prevent HIV infection. If you choose to take medicine to prevent HIV, you should first get tested for HIV. You should then be tested every 3 months for as long as you are taking the medicine. Pregnancy If you are about to stop having your period (premenopausal) and you may become pregnant, seek counseling before you  get pregnant. Take 400 to 800 micrograms (mcg) of folic acid every day if you become pregnant. Ask for birth control (contraception) if you want to prevent pregnancy. Osteoporosis and menopause Osteoporosis is a disease in which the bones lose minerals and strength with aging. This can result in bone fractures. If you are 70 years old or older, or if you are at risk for osteoporosis and fractures, ask your health care provider if you should: Be screened for bone loss. Take a calcium or vitamin D supplement to lower your risk of fractures. Be given hormone replacement therapy (HRT) to treat symptoms of menopause. Follow these instructions at home: Alcohol use Do not drink alcohol if: Your health care provider tells you not to drink. You are pregnant, may be pregnant, or are planning to become pregnant. If you drink alcohol: Limit how much you have to: 0-1 drink a day. Know how much alcohol is in your drink. In the [place] one drink equals one 12 oz bottle of beer (355 mL), one 5 oz glass of wine (148 mL), or one 1 oz glass of hard liquor (44 mL). Lifestyle Do not use any products that contain nicotine or tobacco. These products include cigarettes, chewing tobacco, and vaping devices, such as e-cigarettes. If you need help quitting, ask your health care provider. Do not use street drugs. Do not share needles. Ask your health care provider for help if you need support or information about quitting drugs. General instructions Schedule regular health, dental, and eye exams. Stay current with your vaccines. Tell your health care provider if: You often feel depressed. You have ever been abused or do not feel safe at home. Summary Adopting a healthy lifestyle and getting preventive care are important in promoting health and wellness. Follow your health care provider's instructions about healthy diet, exercising, and getting tested or screened for diseases. Follow your health care provider's  instructions on monitoring your cholesterol and blood pressure. This information is not intended to replace advice given to you by your health care provider. Make sure you discuss any questions you have with your health care provider. Document Revised: 01/05/2021 Document Reviewed: 01/05/2021 Elsevier Patient Education  [place].

## 2022-11-09 NOTE — Progress Notes (Signed)
Complete physical exam  Patient: Brandy Todd   DOB: 03-16-1961   62 y.o. Female  MRN: [id number]  Subjective:    Chief Complaint  Patient presents with   Annual Exam    Brandy Todd is a 62 y.o. female who presents today for a complete physical exam. She reports consuming a general diet. Home exercise routine includes elliptical bike at least 2 hours per week. She generally feels well. She reports sleeping fairly well. She does not have additional problems to discuss today.  Had mammogram, pap smear and bone density [facility]   Most recent fall risk assessment:     No data to display           Most recent depression screenings:    11/09/2022    7:56 AM 06/29/2022    3:40 PM  PHQ 2/9 Scores  PHQ - 2 Score 0 1  PHQ- 9 Score 2 2    Vision:Within last year and Dental: No current dental problems and Receives regular dental care  Patient Active Problem List   Diagnosis Date Noted   Routine general medical examination at a health care facility 11/09/2022   Idiopathic gout 08/07/2018   Essential hypertension 09/12/2009   Allergies 01/23/2008   Asthma 01/23/2008   GERD 01/23/2008      Patient Care Team: Farrel Conners, MD as PCP - General (Family Medicine) Molli Posey, MD as Consulting Physician (Obstetrics and Gynecology) Rolm Bookbinder, MD as Consulting Physician (Dermatology)   Outpatient Medications Prior to Visit  Medication Sig   Albuterol Sulfate (PROAIR RESPICLICK) 123XX123 (90 Base) MCG/ACT AEPB Inhale 2 puffs into the lungs 4 (four) times daily as needed.   BIOTIN PO Take 10,000 mcg by mouth daily.   Calcium Carbonate-Vitamin D 600-200 MG-UNIT TABS Take 1 tablet by mouth daily. Take 500 mg daily   cetirizine (ZYRTEC) 10 MG tablet Take 10 mg by mouth daily.   Cyanocobalamin (VITAMIN B-12 PO) Take 2,500 mcg by mouth daily at 6 (six) AM.   Probiotic Product (PROBIOTIC PO) Take 94 mg by mouth daily at 6 (six) AM.   Wheat Dextrin  (BENEFIBER) POWD Take 1 Scoop by mouth daily at 6 (six) AM.   [DISCONTINUED] allopurinol (ZYLOPRIM) 100 MG tablet Take 1 tablet (100 mg total) by mouth daily.   [DISCONTINUED] Fluticasone Furoate (ARNUITY ELLIPTA) 100 MCG/ACT AEPB Inhale 1 puff into the lungs daily.   [DISCONTINUED] lisinopril (ZESTRIL) 5 MG tablet Take 1 tablet (5 mg total) by mouth daily.   [DISCONTINUED] omeprazole (PRILOSEC) 20 MG capsule TAKE (1) CAPSULE DAILY. (Patient taking differently: Take 20 mg by mouth every other day.)   No facility-administered medications prior to visit.    Review of Systems  HENT:  Negative for hearing loss.   Eyes:  Negative for blurred vision.  Respiratory:  Negative for shortness of breath.   Cardiovascular:  Negative for chest pain.  Gastrointestinal: Negative.   Genitourinary: Negative.   Musculoskeletal:  Negative for back pain.  Neurological:  Negative for headaches.  Psychiatric/Behavioral:  Negative for depression.   All other systems reviewed and are negative.         Objective:     BP 136/80 (BP Location: Right Arm, Patient Position: Sitting, Cuff Size: Normal)   Pulse 75   Temp (!) 97.5 F (36.4 C) (Oral)   Ht 5' 1.5" (1.562 m)   Wt 166 lb (75.3 kg)   LMP 10/16/2010   SpO2 95%  BMI 30.86 kg/m    Physical Exam Vitals reviewed.  Constitutional:      Appearance: Normal appearance. She is well-groomed and normal weight.  HENT:     Right Ear: Tympanic membrane normal.     Left Ear: Tympanic membrane normal.     Mouth/Throat:     Mouth: Mucous membranes are moist.     Pharynx: No posterior oropharyngeal erythema.  Eyes:     Conjunctiva/sclera: Conjunctivae normal.  Neck:     Thyroid: No thyromegaly.  Cardiovascular:     Rate and Rhythm: Normal rate and regular rhythm.     Pulses: Normal pulses.     Heart sounds: S1 normal and S2 normal.  Pulmonary:     Effort: Pulmonary effort is normal.     Breath sounds: Normal breath sounds and air entry.   Abdominal:     General: Bowel sounds are normal.  Musculoskeletal:     Right lower leg: No edema.     Left lower leg: No edema.  Neurological:     Mental Status: She is alert and oriented to person, place, and time. Mental status is at baseline.     Gait: Gait is intact.  Psychiatric:        Mood and Affect: Mood and affect normal.        Speech: Speech normal.        Behavior: Behavior normal.        Judgment: Judgment normal.      No results found for any visits on 11/09/22.     Assessment & Plan:    Routine Health Maintenance and Physical Exam  Immunization History  Administered Date(s) Administered   Influenza Whole 06/04/2013   Influenza,inj,Quad PF,6+ Mos 06/14/2019   Influenza-Unspecified 06/13/2015, 05/17/2020, 05/19/2021, 06/14/2022   PFIZER(Purple Top)SARS-COV-2 Vaccination 11/09/2019, 12/10/2019, 06/28/2020   Tdap 04/20/2011, 11/09/2022   Zoster Recombinat (Shingrix) 07/21/2021, 10/23/2021    Health Maintenance  Topic Date Due   COVID-19 Vaccine (5 - 2023-24 season) 11/09/2023 (Originally 04/30/2022)   HIV Screening  11/09/2023 (Originally 12/09/1975)   MAMMOGRAM  09/22/2023   PAP SMEAR-Modifier  04/05/2025   COLONOSCOPY (Pts 45-19yr Insurance coverage will need to be confirmed)  09/03/2032   DTaP/Tdap/Td (3 - Td or Tdap) 11/08/2032   INFLUENZA VACCINE  Completed   Hepatitis C Screening  Completed   Zoster Vaccines- Shingrix  Completed   HPV VACCINES  Aged Out    Discussed health benefits of physical activity, and encouraged her to engage in regular exercise appropriate for her age and condition.  Problem List Items Addressed This Visit       Unprioritized   Essential hypertension   Relevant Medications   lisinopril (ZESTRIL) 5 MG tablet   Other Relevant Orders   CMP   TSH   Asthma   Relevant Medications   Fluticasone Furoate (ARNUITY ELLIPTA) 100 MCG/ACT AEPB   GERD   Relevant Medications   omeprazole (PRILOSEC) 20 MG capsule   Idiopathic  gout   Relevant Medications   allopurinol (ZYLOPRIM) 100 MG tablet   Routine general medical examination at a health care facility - Primary  Normal physical exam findings today, she is due for her annual bloodwork -- needed medications refilled also. RTC yearly for annual physicals. We discussed sleep hygiene and healthy diet.   Other Visit Diagnoses     Hyperlipidemia, unspecified hyperlipidemia type       Relevant Medications   lisinopril (ZESTRIL) 5 MG tablet   Other Relevant Orders  Lipid Panel      Return in about 1 year (around 11/09/2023) for annual physical exam.     Farrel Conners, MD

## 2022-11-11 NOTE — Addendum Note (Signed)
Addended by: Farrel Conners on: 11/11/2022 12:54 PM   Modules accepted: Orders

## 2022-12-13 ENCOUNTER — Other Ambulatory Visit: Payer: Commercial Managed Care - PPO

## 2022-12-13 ENCOUNTER — Telehealth: Payer: Self-pay | Admitting: Family Medicine

## 2022-12-13 ENCOUNTER — Other Ambulatory Visit (INDEPENDENT_AMBULATORY_CARE_PROVIDER_SITE_OTHER): Payer: Commercial Managed Care - PPO

## 2022-12-13 DIAGNOSIS — R7989 Other specified abnormal findings of blood chemistry: Secondary | ICD-10-CM

## 2022-12-13 LAB — TSH: TSH: 5.23 u[IU]/mL (ref 0.35–5.50)

## 2022-12-13 NOTE — Telephone Encounter (Signed)
Routine Preventative Care Form to be filled out-placed in dr's folder.  Call pt at [phone] upon completion.

## 2022-12-16 NOTE — Telephone Encounter (Signed)
Spoke with the patient and informed her Dr Casimiro Needle completed the form, this was left at the front desk for pick up and PCP charged a $29 fee.  Copy sent to be scanned.

## 2022-12-20 ENCOUNTER — Encounter: Payer: Self-pay | Admitting: Family Medicine

## 2023-06-24 ENCOUNTER — Encounter: Payer: Self-pay | Admitting: Family Medicine

## 2023-06-24 ENCOUNTER — Ambulatory Visit (INDEPENDENT_AMBULATORY_CARE_PROVIDER_SITE_OTHER): Payer: Commercial Managed Care - PPO | Admitting: Family Medicine

## 2023-06-24 VITALS — BP 134/78 | HR 110 | Temp 98.1°F | Ht 61.5 in | Wt 172.6 lb

## 2023-06-24 DIAGNOSIS — M7071 Other bursitis of hip, right hip: Secondary | ICD-10-CM

## 2023-06-24 DIAGNOSIS — E785 Hyperlipidemia, unspecified: Secondary | ICD-10-CM | POA: Insufficient documentation

## 2023-06-24 DIAGNOSIS — M25551 Pain in right hip: Secondary | ICD-10-CM

## 2023-06-24 DIAGNOSIS — M7072 Other bursitis of hip, left hip: Secondary | ICD-10-CM

## 2023-06-24 DIAGNOSIS — M8580 Other specified disorders of bone density and structure, unspecified site: Secondary | ICD-10-CM | POA: Insufficient documentation

## 2023-06-24 DIAGNOSIS — M25552 Pain in left hip: Secondary | ICD-10-CM | POA: Diagnosis not present

## 2023-06-24 DIAGNOSIS — M67959 Unspecified disorder of synovium and tendon, unspecified thigh: Secondary | ICD-10-CM | POA: Diagnosis not present

## 2023-06-24 NOTE — Progress Notes (Signed)
   Established Patient Office Visit   Subjective  Patient ID: Brandy Todd, female    DOB: 04/18/1961  Age: 62 y.o. MRN: [id number]  Chief Complaint  Patient presents with   Hip Pain    Started 6 months ago, in the last month patient has been having this pain all the time, patient states that the hip pain decreases after walking, rate of pain 8 of 10     Patient is a 62 year old female seen with Dr. Casimiro Needle who presents for chronic concern.  Patient with lateral hip pain x "a while" maybe a yr or so, with worsening symptoms in the last month.  Pt notes a sharp, intense 8/10 pain in b/l hips with getting up from seated position.  Better with walking but still there.  Pt endorses sitting for most of the day while at work.  Denies injury or increased activity in the last month.  Pt inquires if symptoms could be related to gluteal tendinopathy after seeing a post online.  Hip Pain       ROS Negative unless stated above    Objective:     BP 134/78 (BP Location: Left Arm, Patient Position: Sitting, Cuff Size: Normal)   Pulse (!) 110   Temp 98.1 F (36.7 C) (Oral)   Ht 5' 1.5" (1.562 m)   Wt 172 lb 9.6 oz (78.3 kg)   LMP 10/16/2010   SpO2 99%   BMI 32.08 kg/m    Physical Exam Constitutional:      Appearance: Normal appearance.  HENT:     Head: Normocephalic and atraumatic.     Nose: Nose normal.     Mouth/Throat:     Mouth: Mucous membranes are moist.  Eyes:     Extraocular Movements: Extraocular movements intact.     Conjunctiva/sclera: Conjunctivae normal.     Pupils: Pupils are equal, round, and reactive to light.  Pulmonary:     Effort: Pulmonary effort is normal.  Musculoskeletal:        General: Tenderness present. No swelling or deformity. Normal range of motion.     Right lower leg: No edema.     Left lower leg: No edema.     Comments: TTP of lateral and posterior lateral hips.  No TTP of IT band, gluteus muscles, cervical, thoracic, or lumbar spine, or  paraspinal muscles.  Negative logroll, straight leg raise, FADIR, FABER bilaterally.  Skin:    General: Skin is warm and dry.  Neurological:     Mental Status: She is alert.     No results found for any visits on 06/24/23.    Assessment & Plan:  Bilateral hip pain  Iliopsoas bursitis of both hips  Tendinopathy involving hip  Chronic pain in b/l hips.  Discussed possible causes including bursitis versus other tendinopathy.  Given symptoms worsened with applying stress consider tendinopathy.  Advised menopause can affect muscles and joints overall given the lack of estrogen.  Discussed supportive care including ice, heat, topical analgesics, Tylenol or NSAIDs and stretching.  Given stretching handouts.  Advised to follow-up in 4 to 6 weeks for continued or worsening symptoms.  Return if symptoms worsen or fail to improve.   Deeann Saint, MD

## 2023-08-04 ENCOUNTER — Ambulatory Visit (INDEPENDENT_AMBULATORY_CARE_PROVIDER_SITE_OTHER): Payer: Commercial Managed Care - PPO | Admitting: Family Medicine

## 2023-08-04 ENCOUNTER — Encounter: Payer: Self-pay | Admitting: Family Medicine

## 2023-08-04 VITALS — BP 118/78 | HR 109 | Temp 98.1°F | Ht 61.5 in | Wt 171.8 lb

## 2023-08-04 DIAGNOSIS — M7061 Trochanteric bursitis, right hip: Secondary | ICD-10-CM

## 2023-08-04 MED ORDER — METHYLPREDNISOLONE 4 MG PO TBPK: ORAL_TABLET | ORAL | 0 refills | Status: DC

## 2023-08-04 NOTE — Progress Notes (Signed)
Established Patient Office Visit  Subjective   Patient ID: Brandy Todd, female    DOB: March 19, 1961  Age: 62 y.o. MRN: [id number]  Chief Complaint  Patient presents with   Hip Pain    Patient complains of recurrent right hip pain    Patient reports that she saw Dr. Salomon Fick about a month ago for the BL hip pain-- states she was doing her exercises that she was given and the pain did improve, states the left hip is improved overall-- still using icy hot and ibuprofen as needed. Right hip is not improved-- it got better temporarily but it is hurting again after putting up decorations. Pt reports that she sits for long periods of time at work in front of a computer and thinks this might have something to do with her symptoms, states she started standing at work.     Current Outpatient Medications  Medication Instructions   Albuterol Sulfate (PROAIR RESPICLICK) 108 (90 Base) MCG/ACT AEPB 2 puffs, Inhalation, 4 times daily PRN   allopurinol (ZYLOPRIM) 100 mg, Oral, Daily   BIOTIN PO 10,000 mcg, Oral, Daily,     Calcium Carbonate-Vitamin D 600-200 MG-UNIT TABS 1 tablet, Oral, Daily, Take 500 mg daily    cetirizine (ZYRTEC) 10 mg, Daily   Cyanocobalamin (VITAMIN B-12 PO) 2,500 mcg, Oral, Daily   Fluticasone Furoate (ARNUITY ELLIPTA) 100 MCG/ACT AEPB 1 puff, Inhalation, Daily   lisinopril (ZESTRIL) 5 mg, Oral, Daily   methylPREDNISolone (MEDROL DOSEPAK) 4 MG TBPK tablet Take package as directed.   omeprazole (PRILOSEC) 20 mg, Oral, Every other day   Probiotic Product (PROBIOTIC PO) 94 mg, Oral, Daily   Wheat Dextrin (BENEFIBER) POWD 1 Scoop, Oral, Daily    Patient Active Problem List   Diagnosis Date Noted   Hyperlipidemia 06/24/2023   Osteopenia 06/24/2023   Routine general medical examination at a health care facility 11/09/2022   Gout 08/07/2018   Hypertensive disorder 09/12/2009   Allergies 01/23/2008   Asthma 01/23/2008   GERD 01/23/2008      Review of Systems  All other  systems reviewed and are negative.     Objective:     BP 118/78 (BP Location: Left Arm, Patient Position: Sitting, Cuff Size: Normal)   Pulse (!) 109   Temp 98.1 F (36.7 C) (Oral)   Ht 5' 1.5" (1.562 m)   Wt 171 lb 12.8 oz (77.9 kg)   LMP 10/16/2010   SpO2 99%   BMI 31.94 kg/m    Physical Exam Constitutional:      Appearance: Normal appearance. She is obese.  Pulmonary:     Effort: Pulmonary effort is normal.  Musculoskeletal:        General: No tenderness.     Right lower leg: No edema.     Left lower leg: No edema.  Neurological:     Mental Status: She is alert and oriented to person, place, and time. Mental status is at baseline.     Gait: Gait abnormal (favoring the right hip -- gait is antalgic).  Psychiatric:        Mood and Affect: Mood normal.        Behavior: Behavior normal.      No results found for any visits on 08/04/23.    The 10-year ASCVD risk score (Arnett DK, et al., 2019) is: 3.8%    Assessment & Plan:  Trochanteric bursitis of right hip -     methylPREDNISolone; Take package as directed.  Dispense: 21 each; Refill:  0   Pt has had some improvement, however the right hip is still very painful, I will give patient a course of medrol dose pak to seeif this will reduce the inflammation. If se continues to have pain she will let me know and I will place a referral to sports medicine for possible need for injection  Return in about 14 weeks (around 11/10/2023) for annual physical exam.    Karie Georges, MD

## 2023-11-16 LAB — HM MAMMOGRAPHY

## 2023-11-21 ENCOUNTER — Ambulatory Visit (INDEPENDENT_AMBULATORY_CARE_PROVIDER_SITE_OTHER): Payer: Commercial Managed Care - PPO | Admitting: Family Medicine

## 2023-11-21 ENCOUNTER — Encounter: Payer: Self-pay | Admitting: Family Medicine

## 2023-11-21 VITALS — BP 132/78 | HR 85 | Temp 98.0°F | Ht 61.75 in | Wt 172.4 lb

## 2023-11-21 DIAGNOSIS — M1000 Idiopathic gout, unspecified site: Secondary | ICD-10-CM | POA: Diagnosis not present

## 2023-11-21 DIAGNOSIS — E782 Mixed hyperlipidemia: Secondary | ICD-10-CM

## 2023-11-21 DIAGNOSIS — R739 Hyperglycemia, unspecified: Secondary | ICD-10-CM | POA: Diagnosis not present

## 2023-11-21 DIAGNOSIS — J4520 Mild intermittent asthma, uncomplicated: Secondary | ICD-10-CM

## 2023-11-21 DIAGNOSIS — I10 Essential (primary) hypertension: Secondary | ICD-10-CM

## 2023-11-21 DIAGNOSIS — K219 Gastro-esophageal reflux disease without esophagitis: Secondary | ICD-10-CM

## 2023-11-21 DIAGNOSIS — Z0000 Encounter for general adult medical examination without abnormal findings: Secondary | ICD-10-CM

## 2023-11-21 LAB — COMPREHENSIVE METABOLIC PANEL
ALT: 25 U/L (ref 0–35)
AST: 25 U/L (ref 0–37)
Albumin: 4.4 g/dL (ref 3.5–5.2)
Alkaline Phosphatase: 70 U/L (ref 39–117)
BUN: 16 mg/dL (ref 6–23)
CO2: 24 meq/L (ref 19–32)
Calcium: 9.5 mg/dL (ref 8.4–10.5)
Chloride: 103 meq/L (ref 96–112)
Creatinine, Ser: 1.04 mg/dL (ref 0.40–1.20)
GFR: 57.42 mL/min — ABNORMAL LOW (ref >60.00)
Glucose, Bld: 122 mg/dL — ABNORMAL HIGH (ref 70–99)
Potassium: 4 meq/L (ref 3.5–5.1)
Sodium: 137 meq/L (ref 135–145)
Total Bilirubin: 0.9 mg/dL (ref 0.2–1.2)
Total Protein: 7.9 g/dL (ref 6.0–8.3)

## 2023-11-21 LAB — HEMOGLOBIN A1C: Hgb A1c MFr Bld: 5.4 % (ref 4.6–6.5)

## 2023-11-21 LAB — LIPID PANEL
Cholesterol: 188 mg/dL (ref 0–200)
HDL: 81.6 mg/dL (ref >39.00)
LDL Cholesterol: 67 mg/dL (ref 0–99)
NonHDL: 106.41
Total CHOL/HDL Ratio: 2
Triglycerides: 198 mg/dL — ABNORMAL HIGH (ref 0.0–149.0)
VLDL: 39.6 mg/dL (ref 0.0–40.0)

## 2023-11-21 MED ORDER — ARNUITY ELLIPTA 100 MCG/ACT IN AEPB
1.0000 | INHALATION_SPRAY | Freq: Every day | RESPIRATORY_TRACT | 3 refills | Status: AC
Start: 2023-11-21 — End: ?

## 2023-11-21 MED ORDER — LISINOPRIL 5 MG PO TABS: 5.0000 mg | ORAL_TABLET | Freq: Every day | ORAL | 3 refills | Status: AC

## 2023-11-21 MED ORDER — ALLOPURINOL 100 MG PO TABS: 100.0000 mg | ORAL_TABLET | Freq: Every day | ORAL | 3 refills | Status: AC

## 2023-11-21 MED ORDER — OMEPRAZOLE 20 MG PO CPDR: 20.0000 mg | DELAYED_RELEASE_CAPSULE | ORAL | 3 refills | Status: AC

## 2023-11-21 NOTE — Patient Instructions (Signed)

## 2023-11-21 NOTE — Progress Notes (Signed)
 Complete physical exam  Patient: Brandy Todd   DOB: 1961-02-26   63 y.o. Female  MRN: [id number]  Subjective:    Chief Complaint  Patient presents with   Annual Exam    Brandy Todd is a 63 y.o. female who presents today for a complete physical exam. She reports consuming a general diet. Pt eats good sources of protein, fish, chicken and other meats, usually pasta or potatoes as a side to her dinner. Eats fruits and some veggies. Takes an MVI, calcium with D. Home exercise routine includes pt has a recumbent bike, rides about 3 times per week, lots of walking. She generally feels well. She reports sleeping fairly well. Does have some nighttime awakenings, occasionally has to pee at night. She does not have additional problems to discuss today.    Most recent fall risk assessment:    06/24/2023    2:32 PM  Fall Risk   Falls in the past year? 0  Number falls in past yr: 0  Injury with Fall? 0  Risk for fall due to : No Fall Risks  Follow up Falls evaluation completed     Most recent depression screenings:    06/24/2023    2:32 PM 11/09/2022    7:56 AM  PHQ 2/9 Scores  PHQ - 2 Score 0 0  PHQ- 9 Score 2 2    Vision:Within last year and they are monitoring her for cataracts and Dental: No current dental problems and Receives regular dental care  Patient Active Problem List   Diagnosis Date Noted   Hyperlipidemia 06/24/2023   Osteopenia 06/24/2023   Routine general medical examination at a health care facility 11/09/2022   Gout 08/07/2018   Hypertensive disorder 09/12/2009   Allergies 01/23/2008   Asthma 01/23/2008   GERD 01/23/2008      Patient Care Team: Karie Georges, MD as PCP - General (Family Medicine) Richarda Overlie, MD as Consulting Physician (Obstetrics and Gynecology) Venancio Poisson, MD as Consulting Physician (Dermatology)   Outpatient Medications Prior to Visit  Medication Sig   Albuterol Sulfate (PROAIR RESPICLICK) 108 (90 Base) MCG/ACT AEPB  Inhale 2 puffs into the lungs 4 (four) times daily as needed.   BIOTIN PO Take 10,000 mcg by mouth daily.   Calcium Carbonate-Vitamin D 600-200 MG-UNIT TABS Take 1 tablet by mouth daily. Take 500 mg daily   cetirizine (ZYRTEC) 10 MG tablet Take 10 mg by mouth daily.   Cyanocobalamin (VITAMIN B-12 PO) Take 2,500 mcg by mouth daily at 6 (six) AM.   Probiotic Product (PROBIOTIC PO) Take 94 mg by mouth daily at 6 (six) AM.   Wheat Dextrin (BENEFIBER) POWD Take 1 Scoop by mouth daily at 6 (six) AM.   [DISCONTINUED] allopurinol (ZYLOPRIM) 100 MG tablet Take 1 tablet (100 mg total) by mouth daily.   [DISCONTINUED] Fluticasone Furoate (ARNUITY ELLIPTA) 100 MCG/ACT AEPB Inhale 1 puff into the lungs daily.   [DISCONTINUED] lisinopril (ZESTRIL) 5 MG tablet Take 1 tablet (5 mg total) by mouth daily.   [DISCONTINUED] methylPREDNISolone (MEDROL DOSEPAK) 4 MG TBPK tablet Take package as directed.   [DISCONTINUED] omeprazole (PRILOSEC) 20 MG capsule Take 1 capsule (20 mg total) by mouth every other day.   No facility-administered medications prior to visit.    Review of Systems  HENT:  Negative for hearing loss.   Eyes:  Negative for blurred vision.  Respiratory:  Negative for shortness of breath.   Cardiovascular:  Negative for chest pain.  Gastrointestinal: Negative.  Genitourinary: Negative.   Musculoskeletal:  Negative for back pain.  Neurological:  Negative for headaches.  Psychiatric/Behavioral:  Negative for depression.        Objective:     BP 132/78   Pulse 85   Temp 98 F (36.7 C) (Oral)   Ht 5' 1.75" (1.568 m)   Wt 172 lb 6.4 oz (78.2 kg)   LMP 10/16/2010   SpO2 98%   BMI 31.79 kg/m    Physical Exam Vitals reviewed.  Constitutional:      Appearance: Normal appearance. She is well-groomed and normal weight.  HENT:     Right Ear: Tympanic membrane and ear canal normal.     Left Ear: Tympanic membrane and ear canal normal.     Mouth/Throat:     Mouth: Mucous membranes are  moist.     Pharynx: No posterior oropharyngeal erythema.  Eyes:     Conjunctiva/sclera: Conjunctivae normal.  Neck:     Thyroid: No thyromegaly.  Cardiovascular:     Rate and Rhythm: Normal rate and regular rhythm.     Pulses: Normal pulses.     Heart sounds: S1 normal and S2 normal.  Pulmonary:     Effort: Pulmonary effort is normal.     Breath sounds: Normal breath sounds and air entry.  Abdominal:     General: Abdomen is flat. Bowel sounds are normal.     Palpations: Abdomen is soft.  Musculoskeletal:     Right lower leg: No edema.     Left lower leg: No edema.  Lymphadenopathy:     Cervical: No cervical adenopathy.  Neurological:     Mental Status: She is alert and oriented to person, place, and time. Mental status is at baseline.     Gait: Gait is intact.  Psychiatric:        Mood and Affect: Mood and affect normal.        Speech: Speech normal.        Behavior: Behavior normal.        Judgment: Judgment normal.     No results found for any visits on 11/21/23.     Assessment & Plan:    Routine Health Maintenance and Physical Exam  Immunization History  Administered Date(s) Administered   Influenza Whole 06/04/2013   Influenza,inj,Quad PF,6+ Mos 06/14/2019   Influenza-Unspecified 06/13/2015, 05/15/2020, 05/17/2020, 05/19/2021, 06/13/2021, 06/14/2022, 05/17/2023   PFIZER(Purple Top)SARS-COV-2 Vaccination 11/09/2019, 12/10/2019, 06/28/2020   Tdap 04/20/2011, 11/09/2022   Unspecified SARS-COV-2 Vaccination 09/11/2021   Zoster Recombinant(Shingrix) 07/21/2021, 10/23/2021   Zoster, Live 08/13/2021    Health Maintenance  Topic Date Due   Pneumococcal Vaccine 35-18 Years old (1 of 2 - PCV) Never done   COVID-19 Vaccine (5 - 2024-25 season) 05/01/2023   HIV Screening  11/20/2024 (Originally 12/09/1975)   MAMMOGRAM  11/03/2024   Cervical Cancer Screening (HPV/Pap Cotest)  11/09/2027   Colonoscopy  09/03/2032   DTaP/Tdap/Td (3 - Td or Tdap) 11/08/2032   INFLUENZA  VACCINE  Completed   Hepatitis C Screening  Completed   Zoster Vaccines- Shingrix  Completed   HPV VACCINES  Aged Out    Discussed health benefits of physical activity, and encouraged her to engage in regular exercise appropriate for her age and condition.  Mixed hyperlipidemia -     Lipid panel; Future  Idiopathic gout, unspecified chronicity, unspecified site -     Allopurinol; Take 1 tablet (100 mg total) by mouth daily.  Dispense: 90 tablet; Refill: 3  Mild intermittent asthma  without complication -     Arnuity Ellipta; Inhale 1 puff into the lungs daily.  Dispense: 90 each; Refill: 3  Essential hypertension -     Lisinopril; Take 1 tablet (5 mg total) by mouth daily.  Dispense: 90 tablet; Refill: 3 -     Comprehensive metabolic panel; Future  Gastroesophageal reflux disease without esophagitis -     Omeprazole; Take 1 capsule (20 mg total) by mouth every other day.  Dispense: 45 capsule; Refill: 3  Routine general medical examination at a health care facility  Hyperglycemia -     Hemoglobin A1c; Future  Normal physical exam findings today, lung are clear on exam. Labs have been ordered for annual surveillance, health maintenance reviewed and is UTD. I counseled the patient on the low carb diet to help with her weight loss goals. RTC for yearly visits.  Return in about 1 year (around 11/20/2024) for annual physical exam.     Karie Georges, MD

## 2023-11-24 ENCOUNTER — Encounter: Payer: Commercial Managed Care - PPO | Admitting: Family Medicine

## 2023-11-24 ENCOUNTER — Encounter: Payer: Self-pay | Admitting: Family Medicine

## 2023-12-30 ENCOUNTER — Telehealth: Payer: Self-pay | Admitting: Family Medicine

## 2023-12-30 NOTE — Telephone Encounter (Signed)
 Patient dropped off document  routine preventive care form , to be filled out by provider. Patient requested to send it back via Call Patient to pick up within 7-days. Document is located in providers tray at front office.Please advise at Mobile [phone] (mobile)

## 2024-01-04 NOTE — Telephone Encounter (Signed)
 Spoke with the patient and informed her PCP completed the form at NO charge.  Original left at the front desk and a copy was sent to be scanned.

## 2024-08-02 ENCOUNTER — Telehealth: Payer: Self-pay | Admitting: *Deleted

## 2024-08-02 NOTE — Telephone Encounter (Unsigned)
 Copied from CRM #[id number]. Topic: Clinical - Request for Lab/Test Order >> Aug 02, 2024  3:04 PM Tinnie C wrote: Reason for CRM: Pt has scheduled cpe for 3/26 and would like to complete labs beforehand to go over at the visit. Please order labs if possible and return pts call to schedule lab at [phone]

## 2024-08-09 ENCOUNTER — Ambulatory Visit: Admitting: Family Medicine

## 2024-08-09 ENCOUNTER — Ambulatory Visit

## 2024-08-09 ENCOUNTER — Ambulatory Visit: Payer: Self-pay

## 2024-08-09 VITALS — BP 136/86 | HR 69 | Temp 97.6°F | Ht 61.75 in | Wt 177.0 lb

## 2024-08-09 DIAGNOSIS — S39012A Strain of muscle, fascia and tendon of lower back, initial encounter: Secondary | ICD-10-CM

## 2024-08-09 MED ORDER — CYCLOBENZAPRINE HCL 10 MG PO TABS: 5.0000 mg | ORAL_TABLET | Freq: Three times a day (TID) | ORAL | 0 refills | Status: AC | PRN

## 2024-08-09 MED ORDER — METHYLPREDNISOLONE 4 MG PO TBPK: ORAL_TABLET | ORAL | 0 refills | Status: AC

## 2024-08-09 NOTE — Telephone Encounter (Signed)
 FYI Only or Action Required?: Action required by provider: request for appointment.  Patient was last seen in primary care on 11/21/2023 by Brandy Heron HERO, MD.  Called Nurse Triage reporting Back Pain.  Symptoms began several days ago.  Interventions attempted: Rest, hydration, or home remedies.  Symptoms are: unchanged. Low back pain, no injury.  Triage Disposition: See Physician Within 24 Hours  Patient/caregiver understands and will follow disposition?: Yes     Copied from CRM #[id number]. Topic: Clinical - Red Word Triage >> Aug 09, 2024  7:51 AM Larissa RAMAN wrote: [facility] that prompted transfer to Nurse Triage: rt lower back pain- severe Answer Assessment - Initial Assessment Questions 1. ONSET: When did the pain begin? (e.g., minutes, hours, days)     Tuesday 2. LOCATION: Where does it hurt? (upper, mid or lower back)     low 3. SEVERITY: How bad is the pain?  (e.g., Scale 1-10; mild, moderate, or severe)     Severe    8 4. PATTERN: Is the pain constant? (e.g., yes, no; constant, intermittent)      Comes and goes 5. RADIATION: Does the pain shoot into your legs or somewhere else?     no 6. CAUSE:  What do you think is causing the back pain?      unsure 7. BACK OVERUSE:  Any recent lifting of heavy objects, strenuous work or exercise?     no 8. MEDICINES: What have you taken so far for the pain? (e.g., nothing, acetaminophen, NSAIDS)     Tylenol, IBU 9. NEUROLOGIC SYMPTOMS: Do you have any weakness, numbness, or problems with bowel/bladder control?     NO 10. OTHER SYMPTOMS: Do you have any other symptoms? (e.g., fever, abdomen pain, burning with urination, blood in urine)       NO 11. PREGNANCY: Is there any chance you are pregnant? When was your last menstrual period?       NO  Protocols used: Back Pain-A-AH

## 2024-08-09 NOTE — Progress Notes (Signed)
 Established Patient Office Visit  Subjective   Patient ID: Brandy Todd, female    DOB: 10-10-1960  Age: 63 y.o. MRN: [id number]  Chief Complaint  Patient presents with   Back Pain    Pt c/o lower back pain. Denied injury. Pain was worse yesterday. Felt something on Tuesday. Pt reports she woke up with lower back pain. No radiation. Taking ibuprofen 600mg  and heating pad twice today.     Back Pain  Discussed the use of AI scribe software for clinical note transcription with the patient, who gave verbal consent to proceed.  History of Present Illness   AVALEE Todd is a 63 year old female who presents with acute lower back pain.  She developed right lower back and upper buttock pain yesterday morning. Pain worsens with bending, twisting, and movements like putting on socks. Sitting is tolerable but pain returns with standing. She spent about three hours on the floor wrapping presents two days ago, repeatedly getting up and down, and initially thought it was her usual morning bursitis but recognized this pain as different. Ibuprofen gives partial relief. She denies pain radiating down the legs, numbness, tingling, burning, shooting pain, urinary or bowel changes, constipation, nausea, vomiting, fevers, or chills. She has no prior significant back problems or back imaging.        Current Outpatient Medications  Medication Instructions   Albuterol  Sulfate (PROAIR  RESPICLICK) 108 (90 Base) MCG/ACT AEPB 2 puffs, Inhalation, 4 times daily PRN   allopurinol  (ZYLOPRIM ) 100 mg, Oral, Daily   BIOTIN PO 10,000 mcg, Daily   Calcium Carbonate-Vitamin D 600-200 MG-UNIT TABS 1 tablet, Daily   cetirizine (ZYRTEC) 10 mg, Daily   Cyanocobalamin (VITAMIN B-12 PO) 2,500 mcg, Daily   cyclobenzaprine (FLEXERIL) 5-10 mg, Oral, 3 times daily PRN   Fluticasone Furoate  (ARNUITY ELLIPTA ) 100 MCG/ACT AEPB 1 puff, Inhalation, Daily   lisinopril  (ZESTRIL ) 5 mg, Oral, Daily   methylPREDNISolone  (MEDROL   DOSEPAK) 4 MG TBPK tablet Take package as directed.   omeprazole  (PRILOSEC) 20 mg, Oral, Every other day   Probiotic Product (PROBIOTIC PO) 94 mg, Daily   Wheat Dextrin (BENEFIBER) POWD 1 Scoop, Daily    Patient Active Problem List   Diagnosis Date Noted   Hyperlipidemia 06/24/2023   Osteopenia 06/24/2023   Routine general medical examination at a health care facility 11/09/2022   Gout 08/07/2018   Hypertensive disorder 09/12/2009   Allergies 01/23/2008   Asthma 01/23/2008   GERD 01/23/2008     Review of Systems  Musculoskeletal:  Positive for back pain.  All other systems reviewed and are negative.     Objective:     BP 136/86 (BP Location: Left Arm, Patient Position: Sitting, Cuff Size: Normal)   Pulse 69   Temp 97.6 F (36.4 C) (Oral)   Ht 5' 1.75 (1.568 m)   Wt 177 lb (80.3 kg)   LMP 10/16/2010   SpO2 98%   BMI 32.64 kg/m    Physical Exam Vitals reviewed.  Constitutional:      Appearance: Normal appearance. She is obese.  Cardiovascular:     Pulses: Normal pulses.  Pulmonary:     Effort: Pulmonary effort is normal.  Musculoskeletal:     Lumbar back: Tenderness (right iliac crest tenderness) present.  Neurological:     Mental Status: She is alert and oriented to person, place, and time. Mental status is at baseline.     Motor: No weakness.     Gait: Gait abnormal (antalgic gait secondary  to pain).  Psychiatric:        Mood and Affect: Mood normal.        Behavior: Behavior normal.      No results found for any visits on 08/09/24.    The 10-year ASCVD risk score (Arnett DK, et al., 2019) is: 5.3%    Assessment & Plan:  Strain of muscle, fascia and tendon of lower back, initial encounter -     DG Lumbar Spine Complete; Future -     methylPREDNISolone ; Take package as directed.  Dispense: 21 each; Refill: 0 -     Cyclobenzaprine HCl; Take 0.5-1 tablets (5-10 mg total) by mouth 3 (three) times daily as needed for muscle spasms.  Dispense: 30  tablet; Refill: 0   Assessment and Plan    Acute lower back strain Likely due to prolonged sitting and repeated bending while wrapping presents. Pain localized to the right lower back and upper buttocks, exacerbated by bending, twisting, and standing. No radiation of pain, numbness, tingling, or other neurological symptoms. No prior history of back issues or imaging. Differential includes muscle strain or spasm, with x-rays to rule out degenerative joint disease, disc compression, fracture, or bone lesion. X-rays are important to rule out serious conditions and provide a baseline for future reference. Most cases resolve with conservative management. - Ordered lumbar spine x-rays to rule out serious conditions. - Prescribed Medrol  Dosepak to reduce inflammation. - Prescribed muscle relaxers to alleviate muscle spasms. - Provided handout with stretching exercises, including knee-to-chest stretch. - Advised on stretching and flexibility exercises to alleviate muscle tension. - Discussed potential referral to physical therapy if symptoms do not improve.       No follow-ups on file.    Brandy CHRISTELLA Sharper, MD

## 2024-08-09 NOTE — Telephone Encounter (Signed)
 Noted- ok to close.

## 2024-08-10 NOTE — Telephone Encounter (Signed)
 Labs ordered for patient for her March 2026 visit.

## 2024-08-14 NOTE — Telephone Encounter (Signed)
 Spoke with the patient and scheduled a lab appt on 11/15/2024.

## 2024-08-20 ENCOUNTER — Ambulatory Visit: Payer: Self-pay | Admitting: Family Medicine

## 2024-11-15 ENCOUNTER — Other Ambulatory Visit

## 2024-11-22 ENCOUNTER — Encounter: Admitting: Family Medicine
